# Patient Record
Sex: Male | Born: 1965 | Race: White | Hispanic: No | Marital: Single | Smoking: Never smoker
Health system: Southern US, Community
[De-identification: ages and names within clinical notes are randomized; demographics above are authoritative.]

## PROBLEM LIST (undated history)

## (undated) DIAGNOSIS — S06389A Contusion, laceration, and hemorrhage of brainstem with loss of consciousness of unspecified duration, initial encounter: Secondary | ICD-10-CM

## (undated) DIAGNOSIS — E785 Hyperlipidemia, unspecified: Secondary | ICD-10-CM

## (undated) DIAGNOSIS — I10 Essential (primary) hypertension: Secondary | ICD-10-CM

## (undated) HISTORY — DX: Hyperlipidemia, unspecified: E78.5

## (undated) HISTORY — PX: HIP ARTHROSCOPY: SUR88

## (undated) HISTORY — DX: Essential (primary) hypertension: I10

---

## 2000-01-30 ENCOUNTER — Encounter: Payer: Self-pay | Admitting: Neurosurgery

## 2000-01-30 ENCOUNTER — Encounter: Payer: Self-pay | Admitting: Emergency Medicine

## 2000-01-30 ENCOUNTER — Inpatient Hospital Stay (HOSPITAL_COMMUNITY): Admission: EM | Admit: 2000-01-30 | Discharge: 2000-03-03 | Payer: Self-pay

## 2000-01-31 ENCOUNTER — Encounter: Payer: Self-pay | Admitting: Neurosurgery

## 2000-02-01 ENCOUNTER — Encounter: Payer: Self-pay | Admitting: General Surgery

## 2000-02-02 ENCOUNTER — Encounter: Payer: Self-pay | Admitting: General Surgery

## 2000-02-03 ENCOUNTER — Encounter: Payer: Self-pay | Admitting: General Surgery

## 2000-02-05 ENCOUNTER — Encounter: Payer: Self-pay | Admitting: General Surgery

## 2000-02-06 ENCOUNTER — Encounter: Payer: Self-pay | Admitting: General Surgery

## 2000-02-07 ENCOUNTER — Encounter: Payer: Self-pay | Admitting: Neurosurgery

## 2000-02-07 ENCOUNTER — Encounter: Payer: Self-pay | Admitting: General Surgery

## 2000-02-08 ENCOUNTER — Encounter: Payer: Self-pay | Admitting: General Surgery

## 2000-02-09 ENCOUNTER — Encounter: Payer: Self-pay | Admitting: General Surgery

## 2000-02-10 ENCOUNTER — Encounter: Payer: Self-pay | Admitting: General Surgery

## 2000-02-11 ENCOUNTER — Encounter: Payer: Self-pay | Admitting: General Surgery

## 2000-02-12 ENCOUNTER — Encounter: Payer: Self-pay | Admitting: General Surgery

## 2000-02-13 ENCOUNTER — Encounter: Payer: Self-pay | Admitting: General Surgery

## 2000-02-14 ENCOUNTER — Encounter: Payer: Self-pay | Admitting: General Surgery

## 2000-02-15 ENCOUNTER — Encounter: Payer: Self-pay | Admitting: General Surgery

## 2000-02-16 ENCOUNTER — Encounter: Payer: Self-pay | Admitting: General Surgery

## 2000-02-17 ENCOUNTER — Encounter: Payer: Self-pay | Admitting: General Surgery

## 2000-02-18 ENCOUNTER — Encounter: Payer: Self-pay | Admitting: General Surgery

## 2000-02-19 ENCOUNTER — Encounter: Payer: Self-pay | Admitting: General Surgery

## 2000-02-20 ENCOUNTER — Encounter: Payer: Self-pay | Admitting: General Surgery

## 2000-02-22 ENCOUNTER — Encounter: Payer: Self-pay | Admitting: Neurosurgery

## 2000-02-26 ENCOUNTER — Encounter: Payer: Self-pay | Admitting: Neurosurgery

## 2000-03-03 ENCOUNTER — Inpatient Hospital Stay (HOSPITAL_COMMUNITY)
Admission: RE | Admit: 2000-03-03 | Discharge: 2000-05-11 | Payer: Self-pay | Admitting: Physical Medicine and Rehabilitation

## 2000-03-11 ENCOUNTER — Encounter: Payer: Self-pay | Admitting: Physical Medicine and Rehabilitation

## 2000-04-13 ENCOUNTER — Encounter: Payer: Self-pay | Admitting: Physical Medicine and Rehabilitation

## 2000-04-24 ENCOUNTER — Encounter: Payer: Self-pay | Admitting: Physical Medicine and Rehabilitation

## 2000-05-05 ENCOUNTER — Encounter: Payer: Self-pay | Admitting: Physical Medicine and Rehabilitation

## 2000-05-17 ENCOUNTER — Encounter
Admission: RE | Admit: 2000-05-17 | Discharge: 2000-08-15 | Payer: Self-pay | Admitting: Physical Medicine and Rehabilitation

## 2000-08-16 ENCOUNTER — Encounter
Admission: RE | Admit: 2000-08-16 | Discharge: 2000-09-01 | Payer: Self-pay | Admitting: Physical Medicine and Rehabilitation

## 2000-09-13 ENCOUNTER — Ambulatory Visit (HOSPITAL_COMMUNITY)
Admission: RE | Admit: 2000-09-13 | Discharge: 2000-09-13 | Payer: Self-pay | Admitting: Physical Medicine & Rehabilitation

## 2005-08-06 ENCOUNTER — Ambulatory Visit: Payer: Self-pay | Admitting: Family Medicine

## 2005-08-27 ENCOUNTER — Ambulatory Visit: Payer: Self-pay | Admitting: Family Medicine

## 2005-11-25 ENCOUNTER — Ambulatory Visit: Payer: Self-pay | Admitting: Family Medicine

## 2005-11-26 ENCOUNTER — Ambulatory Visit: Payer: Self-pay | Admitting: Family Medicine

## 2006-01-07 ENCOUNTER — Ambulatory Visit: Payer: Self-pay | Admitting: Family Medicine

## 2010-01-14 ENCOUNTER — Telehealth: Payer: Self-pay | Admitting: Family Medicine

## 2010-04-07 NOTE — Progress Notes (Signed)
Summary: pt needs appt  Phone Note Call from Patient   Caller: Mom Summary of Call: Mother states pt is trying to get a job through a vocational rehab program and they are asking for his medical records to determine if he is healthy enough to work.  I advised her that pt has not been seen in over 4 years and he would need to be seen first.  OK to schedule an appt, since he has not been seen in so long?  Paper chart is on your shelf. Initial call taken by: Lowella Petties CMA, AAMA,  January 14, 2010 1:07 PM  Follow-up for Phone Call        actually go ahead and just send his records that we have  my last note in 07 is fairly self explanatory then if they still request appt we can make it  I will put chart in IN box  Follow-up by: Judith Part MD,  January 14, 2010 1:11 PM  Additional Follow-up for Phone Call Additional follow up Details #1::        [phone] has been disconnected and Myron Stankovich alternate contact # [phone] no longer works at that #. Lewanda Rife LPN  January 15, 2010 3:44 PM   Lyla Son said [facility] will make copies and send to vocational rehab.Lewanda Rife LPN  January 20, 2010 8:42 AM

## 2014-05-20 DIAGNOSIS — H5213 Myopia, bilateral: Secondary | ICD-10-CM | POA: Diagnosis not present

## 2014-05-20 DIAGNOSIS — H4921 Sixth [abducent] nerve palsy, right eye: Secondary | ICD-10-CM | POA: Diagnosis not present

## 2015-08-15 ENCOUNTER — Emergency Department: Payer: Medicare Other

## 2015-08-15 ENCOUNTER — Encounter: Payer: Self-pay | Admitting: Emergency Medicine

## 2015-08-15 ENCOUNTER — Inpatient Hospital Stay
Admission: EM | Admit: 2015-08-15 | Discharge: 2015-08-18 | DRG: 536 | Disposition: A | Discharge status: Skilled Nursing Facility | Payer: Medicare Other | Attending: Internal Medicine | Admitting: Internal Medicine

## 2015-08-15 DIAGNOSIS — M6259 Muscle wasting and atrophy, not elsewhere classified, multiple sites: Secondary | ICD-10-CM | POA: Diagnosis present

## 2015-08-15 DIAGNOSIS — S7290XA Unspecified fracture of unspecified femur, initial encounter for closed fracture: Secondary | ICD-10-CM | POA: Diagnosis not present

## 2015-08-15 DIAGNOSIS — I10 Essential (primary) hypertension: Secondary | ICD-10-CM | POA: Diagnosis not present

## 2015-08-15 DIAGNOSIS — Z87891 Personal history of nicotine dependence: Secondary | ICD-10-CM

## 2015-08-15 DIAGNOSIS — M25551 Pain in right hip: Secondary | ICD-10-CM | POA: Diagnosis present

## 2015-08-15 DIAGNOSIS — R030 Elevated blood-pressure reading, without diagnosis of hypertension: Secondary | ICD-10-CM | POA: Diagnosis present

## 2015-08-15 DIAGNOSIS — G8191 Hemiplegia, unspecified affecting right dominant side: Secondary | ICD-10-CM | POA: Diagnosis present

## 2015-08-15 DIAGNOSIS — S72114A Nondisplaced fracture of greater trochanter of right femur, initial encounter for closed fracture: Principal | ICD-10-CM | POA: Diagnosis present

## 2015-08-15 DIAGNOSIS — W19XXXA Unspecified fall, initial encounter: Secondary | ICD-10-CM | POA: Diagnosis not present

## 2015-08-15 DIAGNOSIS — S069X0D Unspecified intracranial injury without loss of consciousness, subsequent encounter: Secondary | ICD-10-CM | POA: Diagnosis not present

## 2015-08-15 DIAGNOSIS — G9389 Other specified disorders of brain: Secondary | ICD-10-CM | POA: Diagnosis not present

## 2015-08-15 DIAGNOSIS — S72001A Fracture of unspecified part of neck of right femur, initial encounter for closed fracture: Secondary | ICD-10-CM | POA: Diagnosis not present

## 2015-08-15 DIAGNOSIS — Z7401 Bed confinement status: Secondary | ICD-10-CM | POA: Diagnosis not present

## 2015-08-15 DIAGNOSIS — S72111D Displaced fracture of greater trochanter of right femur, subsequent encounter for closed fracture with routine healing: Secondary | ICD-10-CM | POA: Diagnosis not present

## 2015-08-15 DIAGNOSIS — R739 Hyperglycemia, unspecified: Secondary | ICD-10-CM | POA: Diagnosis present

## 2015-08-15 DIAGNOSIS — Z9181 History of falling: Secondary | ICD-10-CM

## 2015-08-15 DIAGNOSIS — S72111A Displaced fracture of greater trochanter of right femur, initial encounter for closed fracture: Secondary | ICD-10-CM | POA: Diagnosis not present

## 2015-08-15 DIAGNOSIS — Z87820 Personal history of traumatic brain injury: Secondary | ICD-10-CM

## 2015-08-15 DIAGNOSIS — S3992XA Unspecified injury of lower back, initial encounter: Secondary | ICD-10-CM | POA: Diagnosis not present

## 2015-08-15 DIAGNOSIS — M545 Low back pain: Secondary | ICD-10-CM | POA: Diagnosis not present

## 2015-08-15 HISTORY — DX: Contusion, laceration, and hemorrhage of brainstem with loss of consciousness of unspecified duration, initial encounter: S06.389A

## 2015-08-15 LAB — URINALYSIS COMPLETE WITH MICROSCOPIC (ARMC ONLY)
Bacteria, UA: NONE SEEN
Bilirubin Urine: NEGATIVE
Glucose, UA: NEGATIVE mg/dL
Hgb urine dipstick: NEGATIVE
Leukocytes, UA: NEGATIVE
Nitrite: NEGATIVE
Protein, ur: NEGATIVE mg/dL
Specific Gravity, Urine: 1.021 (ref 1.005–1.030)
Squamous Epithelial / HPF: NONE SEEN
pH: 7 (ref 5.0–8.0)

## 2015-08-15 LAB — COMPREHENSIVE METABOLIC PANEL
ALT: 23 U/L (ref 17–63)
AST: 24 U/L (ref 15–41)
Albumin: 4.1 g/dL (ref 3.5–5.0)
Alkaline Phosphatase: 72 U/L (ref 38–126)
Anion gap: 6 (ref 5–15)
BUN: 11 mg/dL (ref 6–20)
CO2: 25 mmol/L (ref 22–32)
Calcium: 8.7 mg/dL — ABNORMAL LOW (ref 8.9–10.3)
Chloride: 109 mmol/L (ref 101–111)
Creatinine, Ser: 0.94 mg/dL (ref 0.61–1.24)
GFR calc Af Amer: >60 mL/min (ref >60)
GFR calc non Af Amer: >60 mL/min (ref >60)
Glucose, Bld: 118 mg/dL — ABNORMAL HIGH (ref 65–99)
Potassium: 3.5 mmol/L (ref 3.5–5.1)
Sodium: 140 mmol/L (ref 135–145)
Total Bilirubin: 1.3 mg/dL — ABNORMAL HIGH (ref 0.3–1.2)
Total Protein: 7.5 g/dL (ref 6.5–8.1)

## 2015-08-15 LAB — CBC WITH DIFFERENTIAL/PLATELET
Basophils Absolute: 0 10*3/uL (ref 0–0.1)
Basophils Relative: 0 %
EOS ABS: 0.1 10*3/uL (ref 0–0.7)
Eosinophils Relative: 2 %
HCT: 45 % (ref 40.0–52.0)
HEMOGLOBIN: 15.7 g/dL (ref 13.0–18.0)
LYMPHS ABS: 1.2 10*3/uL (ref 1.0–3.6)
Lymphocytes Relative: 20 %
MCH: 30 pg (ref 26.0–34.0)
MCHC: 34.8 g/dL (ref 32.0–36.0)
MCV: 86.2 fL (ref 80.0–100.0)
Monocytes Absolute: 0.5 10*3/uL (ref 0.2–1.0)
Monocytes Relative: 8 %
NEUTROS ABS: 4.2 10*3/uL (ref 1.4–6.5)
NEUTROS PCT: 70 %
Platelets: 138 10*3/uL — ABNORMAL LOW (ref 150–440)
RBC: 5.22 MIL/uL (ref 4.40–5.90)
RDW: 13.2 % (ref 11.5–14.5)
WBC: 6 10*3/uL (ref 3.8–10.6)

## 2015-08-15 MED ORDER — CALCIUM CARBONATE ANTACID 500 MG PO CHEW
600.0000 mg | CHEWABLE_TABLET | Freq: Every day | ORAL | Status: DC
  Administered 2015-08-15 – 2015-08-18 (×4): 600 mg via ORAL
  Filled 2015-08-15 (×4): qty 3

## 2015-08-15 MED ORDER — ONDANSETRON HCL 4 MG/2ML IJ SOLN: 4.0000 mg | Freq: Four times a day (QID) | INTRAMUSCULAR | Status: DC | PRN

## 2015-08-15 MED ORDER — ACETAMINOPHEN 325 MG PO TABS
650.0000 mg | ORAL_TABLET | Freq: Four times a day (QID) | ORAL | Status: DC | PRN
Start: 2015-08-15 — End: 2015-08-18
  Administered 2015-08-16: 650 mg via ORAL
  Filled 2015-08-15: qty 2

## 2015-08-15 MED ORDER — ONDANSETRON HCL 4 MG PO TABS: 4.0000 mg | ORAL_TABLET | Freq: Four times a day (QID) | ORAL | Status: DC | PRN

## 2015-08-15 MED ORDER — ENOXAPARIN SODIUM 40 MG/0.4ML ~~LOC~~ SOLN
40.0000 mg | SUBCUTANEOUS | Status: DC
  Administered 2015-08-15 – 2015-08-17 (×3): 40 mg via SUBCUTANEOUS
  Filled 2015-08-15 (×4): qty 0.4

## 2015-08-15 MED ORDER — ACETAMINOPHEN 650 MG RE SUPP: 650.0000 mg | Freq: Four times a day (QID) | RECTAL | Status: DC | PRN

## 2015-08-15 MED ORDER — MIRTAZAPINE 15 MG PO TABS
30.0000 mg | ORAL_TABLET | Freq: Every evening | ORAL | Status: DC | PRN
  Administered 2015-08-17: 30 mg via ORAL
  Filled 2015-08-15: qty 2

## 2015-08-15 MED ORDER — SENNOSIDES-DOCUSATE SODIUM 8.6-50 MG PO TABS: 1.0000 | ORAL_TABLET | Freq: Every evening | ORAL | Status: DC | PRN

## 2015-08-15 MED ORDER — CALCIUM CARBONATE 1500 (600 CA) MG PO TABS
600.0000 mg | ORAL_TABLET | Freq: Every day | ORAL | Status: DC
  Filled 2015-08-15: qty 1
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
## 2015-08-15 NOTE — ED Notes (Signed)
[place] three days ago from standing position.  C/o right buttock pain.  Patient has also fallen more frequently since initial fall.  Family is concerned about frequent falls and "change in balance"

## 2015-08-15 NOTE — ED Notes (Signed)
Attempted to call report. Awaiting call back.
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
## 2015-08-16 LAB — BASIC METABOLIC PANEL
Anion gap: 8 (ref 5–15)
BUN: 12 mg/dL (ref 6–20)
CALCIUM: 9.1 mg/dL (ref 8.9–10.3)
CHLORIDE: 108 mmol/L (ref 101–111)
CO2: 24 mmol/L (ref 22–32)
CREATININE: 1.1 mg/dL (ref 0.61–1.24)
GFR calc Af Amer: >60 mL/min (ref >60)
GFR calc non Af Amer: >60 mL/min (ref >60)
GLUCOSE: 181 mg/dL — AB (ref 65–99)
Potassium: 3.7 mmol/L (ref 3.5–5.1)
Sodium: 140 mmol/L (ref 135–145)

## 2015-08-16 LAB — CBC
HCT: 45.2 % (ref 40.0–52.0)
Hemoglobin: 15.7 g/dL (ref 13.0–18.0)
MCH: 30.4 pg (ref 26.0–34.0)
MCHC: 34.8 g/dL (ref 32.0–36.0)
MCV: 87.4 fL (ref 80.0–100.0)
PLATELETS: 157 10*3/uL (ref 150–440)
RBC: 5.17 MIL/uL (ref 4.40–5.90)
RDW: 13.4 % (ref 11.5–14.5)
WBC: 7 10*3/uL (ref 3.8–10.6)

## 2015-08-16 LAB — HEMOGLOBIN A1C: Hgb A1c MFr Bld: 5.2 % (ref 4.0–6.0)

## 2015-08-16 MED ORDER — METOPROLOL TARTRATE 25 MG PO TABS
25.0000 mg | ORAL_TABLET | Freq: Two times a day (BID) | ORAL | Status: DC
  Administered 2015-08-16 – 2015-08-17 (×3): 25 mg via ORAL
  Filled 2015-08-16 (×3): qty 1

## 2015-08-16 MED ORDER — METOPROLOL TARTRATE 25 MG PO TABS: 25.0000 mg | ORAL_TABLET | Freq: Two times a day (BID) | ORAL | Status: DC

## 2015-08-16 NOTE — Discharge Summary (Addendum)
[place] at [place] NAME: Roy Ellison    MR#:  [id number]  [place]:  [birth year]  DATE OF ADMISSION:  08/15/2015 ADMITTING PHYSICIAN: Bettey Costa, MD  DATE OF DISCHARGE: 08/18/2015  PRIMARY CARE PHYSICIAN: No PCP Per Patient    ADMISSION DIAGNOSIS:  Hip fracture, right, closed, initial encounter ([place]) [S72.001A]  DISCHARGE DIAGNOSIS:  Active Problems:   Fall   SECONDARY DIAGNOSIS:   Past Medical History  Diagnosis Date  . Brain stem injury [facility])     HOSPITAL COURSE:   50 year old male with a history of TBI and right hemiparesis who normally ambulates who is unable to ambulate after multiple falls and CT scan showing nondisplaced fracture of right greater trochanter.  1. Minimally displaced greater trochanteric of the lesion injury: Patient was evaluated by orthopedic surgery and physical therapy. This is a non-operable injury. Physical therapy recommended Skilled nursing facility at discharge.. Patient was not having any pain due to history of traumatic brain injury he does not have any sensation on the right side.  2. Hyperglycemia: Hemoglobin A1c was 5.2  3. Elevated blood pressure no diagnosis of hypertension: His blood pressure was significantly elevated here. He was started on metoprolol and will need follow-up with a primary care physician. 4. TBI:   DISCHARGE CONDITIONS AND DIET:   Stable for discharge on a regular diet  CONSULTS OBTAINED:  Treatment Team:  Hessie Knows, MD  DRUG ALLERGIES:  No Known Allergies  DISCHARGE MEDICATIONS:   Current Discharge Medication List    START taking these medications   Details  metoprolol tartrate (LOPRESSOR) 50 MG tablet Take 1 tablet (50 mg total) by mouth 2 (two) times daily. Qty: 60 tablet, Refills: 0      CONTINUE these medications which have NOT CHANGED   Details  calcium carbonate (OSCAL) 1500 (600 Ca) MG TABS tablet Take 600 mg of elemental calcium by mouth  daily.    ibuprofen (ADVIL,MOTRIN) 200 MG tablet Take 200 mg by mouth every 6 (six) hours as needed.    mirtazapine (REMERON) 30 MG tablet Take 1 tablet by mouth at bedtime as needed.              Today   CHIEF COMPLAINT:   Not complaining of pain. Patient is ready for discharge. No acute events reported overnight.   VITAL SIGNS:  Blood pressure 139/77, pulse 64, temperature 98.2 F (36.8 C), temperature source Oral, resp. rate 18, height 5\' 8"  (1.727 m), weight 89.268 kg (196 lb 12.8 oz), SpO2 95 %.   REVIEW OF SYSTEMS:  Review of Systems  Constitutional: Negative for fever, chills and malaise/fatigue.  HENT: Negative for ear discharge, ear pain, hearing loss, nosebleeds and sore throat.   Eyes: Negative for blurred vision and pain.  Respiratory: Negative for cough, hemoptysis, shortness of breath and wheezing.   Cardiovascular: Negative for chest pain, palpitations and leg swelling.  Gastrointestinal: Negative for nausea, vomiting, abdominal pain, diarrhea and blood in stool.  Genitourinary: Negative for dysuria.  Musculoskeletal: Negative for back pain.  Neurological: Negative for dizziness, tremors, speech change, focal weakness, seizures and headaches.  Endo/Heme/Allergies: Does not bruise/bleed easily.  Psychiatric/Behavioral: Negative for depression, suicidal ideas and hallucinations.     PHYSICAL EXAMINATION:  GENERAL:  50 y.o.-year-old patient lying in the bed with no acute distress.  NECK:  Supple, no jugular venous distention. No thyroid enlargement, no tenderness.  LUNGS: Normal breath sounds bilaterally, no wheezing, rales,rhonchi  No use of  accessory muscles of respiration.  CARDIOVASCULAR: S1, S2 normal. No murmurs, rubs, or gallops.  ABDOMEN: Soft, non-tender, non-distended. Bowel sounds present. No organomegaly or mass.  EXTREMITIES: No pedal edema, cyanosis, or clubbing.  PSYCHIATRIC: The patient is alert and oriented x 3.  SKIN: No obvious rash,  lesion, or ulcer. Decreased range of motion right hip Baseline right hemiparesis from TBI DATA REVIEW:   CBC  Recent Labs Lab 08/16/15 0340  WBC 7.0  HGB 15.7  HCT 45.2  PLT 157    Chemistries   Recent Labs Lab 08/15/15 1116 08/16/15 0340  NA 140 140  K 3.5 3.7  CL 109 108  CO2 25 24  GLUCOSE 118* 181*  BUN 11 12  CREATININE 0.94 1.10  CALCIUM 8.7* 9.1  AST 24  --   ALT 23  --   ALKPHOS 72  --   BILITOT 1.3*  --     Cardiac Enzymes No results for input(s): TROPONINI in the last 168 hours.  Microbiology Results  @[email or web address] @  RADIOLOGY:  No results found.    Management plans discussed with the patient and he is in agreement. Stable for discharge home with [facility]  Patient should follow up with a PCP. He was advised to find a primary care physician to follow up with.  CODE STATUS:     Code Status Orders        Start     Ordered   08/15/15 1543  Full code   Continuous     08/15/15 1546    Code Status History    Date Active Date Inactive Code Status Order ID Comments User Context   This patient has a current code status but no historical code status.      TOTAL TIME TAKING CARE OF THIS PATIENT: 36 minutes.    Note: This dictation was prepared with Dragon dictation along with smaller phrase technology. Any transcriptional errors that result from this process are unintentional.  Lind Ausley M.D on 08/18/2015 at 8:36 AM  Between 7am to 6pm - Pager - [phone] After 6pm go to [email or web address] - password [email or web address] [place] [facility]  Office  [phone]  CC: Primary care physician; No PCP Per Patient

## 2015-08-16 NOTE — Evaluation (Signed)
Physical Therapy Evaluation Patient Details Name: Roy Ellison MRN: [id number] DOB: 03-02-1966 Today's Date: 08/16/2015   History of Present Illness  50 y/o male with h/o TBI with signficant R side weakness and sensory loss.  He has been having frequent falls recently and was found to have a non-displaced R hip fx.   Clinical Impression  Pt is pleasant and shows good effort t/o PT but is very limited with standing acts.  He has poor strength and sensation on R at baseline from TBI, but today his knee buckles with any attempt to shift weight onto it.  Despite good R UE strength and use of the hemiwalker he is simply not stable enough on the R to try doing any ambulation.  Pt is not safe to return home and will need rehab to get back to a level of function to be able to go home.    Follow Up Recommendations SNF    Equipment Recommendations   (hemiwalker?)    Recommendations for Other Services       Precautions / Restrictions Precautions Precautions: Fall Restrictions Weight Bearing Restrictions: Yes RLE Weight Bearing: Weight bearing as tolerated      Mobility  Bed Mobility Overal bed mobility: Needs Assistance Bed Mobility: Supine to Sit;Sit to Supine     Supine to sit: Min assist Sit to supine: Min assist   General bed mobility comments: Pt shows good effort trying to get to EOB and using his L UE is able to pull heavily on rails and does not need excessive PT assist  Transfers Overall transfer level: Needs assistance Equipment used: Hemi-walker Transfers: Sit to/from Stand Sit to Stand: Mod assist         General transfer comment: Pt initially leaning back of legs heavily on bed to remain upright.  Pt is able to statically stand with heavy HW reliance and inability to shift weight onto the R.  Ambulation/Gait             General Gait Details: attempted to step, unable to do so safely.  With attempts to put any weight on the R to advance L he has R knee  buckling is essentially unable to keep himself up needing heavy assist and ultiamtely not being safe to try taking a step.   Stairs            Wheelchair Mobility    Modified Rankin (Stroke Patients Only)       Balance Overall balance assessment: Needs assistance Sitting-balance support: Single extremity supported Sitting balance-Leahy Scale: Good     Standing balance support: Single extremity supported Standing balance-Leahy Scale: Poor Standing balance comment: Pt struggles to maintain any WBing on R and is unsafe with sensation deficits and buckling                              Pertinent Vitals/Pain Pain Assessment:  (Pt unable to really feel pain on the R)    Home Living Family/patient expects to be discharged to:: Private residence Living Arrangements: Alone Available Help at Discharge: Family Type of Home: House Home Access: Stairs to enter   Technical brewer of Steps: 4   Home Equipment: Cane - quad;Cane - single point      Prior Function Level of Independence: Independent with assistive device(s)         Comments: Pt did not drive and was a slow walker but able to do what he needed  Hand Dominance        Extremity/Trunk Assessment   Upper Extremity Assessment: RUE deficits/detail (L strong with WFL ROM) RUE Deficits / Details: Pt with very limited AROM and essentially is non-functional with what he does have   RUE Sensation: decreased light touch;decreased proprioception     Lower Extremity Assessment: RLE deficits/detail (L WFL with strength and ROM) RLE Deficits / Details: Pt with very limited R LE strength. Pt lacks TKE on R, has no ankle AROM and what AROM he does have in the hip is very limited       Communication   Communication: Expressive difficulties  Cognition Arousal/Alertness: Awake/alert Behavior During Therapy: WFL for tasks assessed/performed Overall Cognitive Status: Within Functional Limits for  tasks assessed (appears at his baseline)                      General Comments      Exercises        Assessment/Plan    PT Assessment Patient needs continued PT services  PT Diagnosis Difficulty walking;Generalized weakness   PT Problem List Decreased strength;Decreased range of motion;Decreased activity tolerance;Decreased balance;Decreased mobility;Decreased safety awareness  PT Treatment Interventions DME instruction;Gait training;Stair training;Functional mobility training;Therapeutic activities;Therapeutic exercise;Balance training;Neuromuscular re-education;Cognitive remediation;Patient/family education   PT Goals (Current goals can be found in the Care Plan section) Acute Rehab PT Goals Patient Stated Goal: "I want to get back home" PT Goal Formulation: With patient/family Time For Goal Achievement: 08/30/15 Potential to Achieve Goals: Fair    Frequency 7X/week   Barriers to discharge        Co-evaluation               End of Session Equipment Utilized During Treatment: Gait belt Activity Tolerance: Other (comment) (pt unable to take steps safely)             Time: [id number] PT Time Calculation (min) (ACUTE ONLY): 21 min   Charges:   PT Evaluation $PT Eval Moderate Complexity: 1 Procedure     PT G CodesKreg Shropshire, DPT  08/16/2015, 11:58 AM

## 2015-08-16 NOTE — Progress Notes (Signed)
Pt to be discharged tomorrow or Monday to SNF for rehab. Failed PT evaluation. Dr. Benjie Karvonen and Care manager Jeani Hawking aware. Patient's wife also aware.

## 2015-08-16 NOTE — Consult Note (Signed)
50 year old white male seen for evaluation after a fall and right hip fracture. Patient was walking at home yesterday down to his mailbox and when he came back up with me on his hand as he opened the door he lost his balance and fell. He does have a history significant for treatment brain injury and profound right-sided weakness upper and lower extremities. He normally walks without assistive device he reports and works out with doing rehabilitation exercises 3 times a week. He was unable to ambulate after this fall and came to the emergency room where greater trochanter fracture was identified and CT scan confirms it is greater trochanter does not appear to have inter-trochanteric extension  On exam he has profound muscle atrophy of the right upper and lower extremity with significant weakness to abduction on the right hip he is unable to flex extend the ankle to request. He has tenderness over the greater trochanter.  CT reviewed along with x-ray showing minimally displaced greater trochanter avulsion injury  Impression isolated greater trochanteric fracture in the setting with profound weakness on this right side  Recommendation is for physical therapy he may need a hemiwalker on the left or possibly platform walker with right forearm platform attachment the to try to get him mobilized. Not certain as to how much she will be able to cooperate with therapy secondary to some apparent confusion and discussing his prior history
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
## 2015-08-17 MED ORDER — METOPROLOL TARTRATE 25 MG PO TABS
25.0000 mg | ORAL_TABLET | Freq: Once | ORAL | Status: AC
  Administered 2015-08-17: 25 mg via ORAL
  Filled 2015-08-17: qty 1

## 2015-08-17 MED ORDER — METOPROLOL TARTRATE 50 MG PO TABS
50.0000 mg | ORAL_TABLET | Freq: Two times a day (BID) | ORAL | Status: DC
  Administered 2015-08-17 – 2015-08-18 (×2): 50 mg via ORAL
  Filled 2015-08-17 (×2): qty 1
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
## 2015-08-17 NOTE — Progress Notes (Signed)
Physical Therapy Treatment Patient Details Name: Roy Ellison MRN: [id number] DOB: Sep 24, 1965 Today's Date: 08/17/2015    History of Present Illness 50 y/o male with h/o TBI with signficant R side weakness and sensory loss.  He has been having frequent falls recently and was found to have a non-displaced R hip fx.     PT Comments    Pt is very pleasant and motivated with PT but is unable to really take any weight through the R and this is very limiting.  PT attempted to block knee from buckling and actively assist foot onto the floor but he simply was unable to effectively take any weight through the R.  Pt shows great strength and effort with L side, given his lack of sensation on the R with the guarded posturing and inability to straighten knee mobility/ambulation will likely be very difficult.  Follow Up Recommendations  SNF     Equipment Recommendations       Recommendations for Other Services       Precautions / Restrictions Precautions Precautions: Fall Restrictions Weight Bearing Restrictions: No RLE Weight Bearing: Weight bearing as tolerated    Mobility  Bed Mobility Overal bed mobility: Needs Assistance Bed Mobility: Supine to Sit     Supine to sit: Min guard     General bed mobility comments: Pt is able to slowly get to sitting EOB with only L extremities assisting.  Transfers Overall transfer level: Needs assistance Equipment used: Quad cane Transfers: Sit to/from Stand Sit to Stand: Min assist         General transfer comment: Pt shows good effort with L LE in trying to get to standing, but his R hip and knee stay slightly flexed and he needs direct assist just to get foot onto the ground, unable to effectively take any weight through it.  Ambulation/Gait             General Gait Details: again attempted to step.  Despite being determined to try and with good L sided strength he is unable to put any real weigh through R LE (even with PT  blocking knee) to even scoot L foot 1/4"   Stairs            Wheelchair Mobility    Modified Rankin (Stroke Patients Only)       Balance                                    Cognition Arousal/Alertness: Awake/alert Behavior During Therapy: WFL for tasks assessed/performed Overall Cognitive Status: Within Functional Limits for tasks assessed                      Exercises General Exercises - Lower Extremity Ankle Circles/Pumps: 10 reps;AAROM Quad Sets: AAROM;10 reps Short Arc Quad: AAROM;10 reps Heel Slides: AROM;10 reps Hip ABduction/ADduction: AAROM;10 reps    General Comments        Pertinent Vitals/Pain Pain Assessment:  (Pt cannot feel pain in R hip 2/2 TBI)    Home Living                      Prior Function            PT Goals (current goals can now be found in the care plan section) Progress towards PT goals: Progressing toward goals    Frequency  7X/week    PT  Plan Current plan remains appropriate    Co-evaluation             End of Session Equipment Utilized During Treatment: Gait belt Activity Tolerance:  (eager to participate, unable to take weight through R LE) Patient left: with call bell/phone within reach;with bed alarm set     Time: [id number] PT Time Calculation (min) (ACUTE ONLY): 25 min  Charges:  $Therapeutic Exercise: 8-22 mins $Therapeutic Activity: 8-22 mins                    G Codes:      Kreg Shropshire, DPT  08/17/2015, 12:04 PM

## 2015-08-17 NOTE — Progress Notes (Signed)
Patient is in high spirits with extraordinary belief and as such believes he is healed and may not always adhere to proper medical precautions. Wife is to be apprised of his discharge plans, as she will be the support system. Wife is being intimidated by family however, and refuses to be present simultaneously. The angst between the parties is agitating the patient and placing him in emotional distress.

## 2015-08-17 NOTE — Care Management Important Message (Signed)
Important Message  Patient Details  Name: Roy Ellison MRN: [id number] Date of Birth: [birth year]   Medicare Important Message Given:  Yes    Duvan Mousel A, RN 08/17/2015, 1:38 PM

## 2015-08-17 NOTE — Care Management Note (Addendum)
Case Management Note  Patient Details  Name: Roy Ellison MRN: [id number] Date of Birth: 1966-01-18  Subjective/Objective:    Dr Rudene Christians and [facility]-PT are recommending Rehab and [name] agrees that although he wants to go home that he is currently not safe to do so. A social work consult has been made to evaluate possibility of Rehab placement. Case management will follow if [name] is discharged to home.                 Action/Plan:   Expected Discharge Date:  08/16/15               Expected Discharge Plan:     In-House Referral:     Discharge planning Services     Post Acute Care Choice:    Choice offered to:     DME Arranged:    DME Agency:     HH Arranged:    [place] Agency:     Status of Service:     Medicare Important Message Given:    Date Medicare IM Given:    Medicare IM give by:    Date Additional Medicare IM Given:    Additional Medicare Important Message give by:     If discussed at [place] of Stay Meetings, dates discussed:    Additional Comments:  Violeta Lecount A, RN 08/17/2015, 8:53 AM

## 2015-08-17 NOTE — NC FL2 (Signed)
  [place] LEVEL OF CARE SCREENING TOOL     IDENTIFICATION  Patient Name: Roy Ellison Birthdate: Mar 08, 1966 Sex: male Admission Date (Current Location): 08/15/2015  [place] and [place] Number:  Engineering geologist and Address:  [facility], [address], [place], [place] [address]      Provider Number: [id number]  Attending Physician Name and Address:  Bettey Costa, MD  Relative Name and Phone Number:       Current Level of Care: Hospital Recommended Level of Care: [place] Prior Approval Number:    Date Approved/Denied:   PASRR Number:  ([id number])  Discharge Plan: SNF    Current Diagnoses: Patient Active Problem List   Diagnosis Date Noted  . Fall 08/15/2015    Orientation RESPIRATION BLADDER Height & Weight     Self, Place, Time  Normal Continent Weight: 196 lb 12.8 oz (89.268 kg) Height:  5\' 8"  (172.7 cm)  BEHAVIORAL SYMPTOMS/MOOD NEUROLOGICAL BOWEL NUTRITION STATUS   (None)  (None) Continent Diet (Regular)  AMBULATORY STATUS COMMUNICATION OF NEEDS Skin   Extensive Assist Verbally Normal                       Personal Care Assistance Level of Assistance  Bathing, Feeding, Dressing Bathing Assistance: Limited assistance Feeding assistance: Independent Dressing Assistance: Limited assistance     Functional Limitations Info  Sight, Hearing, Speech Sight Info: Impaired Hearing Info: Adequate Speech Info: Adequate    SPECIAL CARE FACTORS FREQUENCY  PT (By licensed PT)     PT Frequency:  (5)              Contractures      Additional Factors Info  Code Status, Allergies Code Status Info:  (Full Code) Allergies Info:  (No Known Allergies)           Current Medications (08/17/2015):  This is the current hospital active medication list Current Facility-Administered Medications  Medication Dose Route Frequency Provider Last Rate Last Dose  . acetaminophen (TYLENOL) tablet 650  mg  650 mg Oral Q6H PRN Bettey Costa, MD   650 mg at 08/16/15 [id number]   Or  . acetaminophen (TYLENOL) suppository 650 mg  650 mg Rectal Q6H PRN Bettey Costa, MD      . calcium carbonate (TUMS - dosed in mg elemental calcium) chewable tablet 600 mg of elemental calcium  600 mg of elemental calcium Oral Daily Sital Mody, MD   600 mg of elemental calcium at 08/17/15 0946  . enoxaparin (LOVENOX) injection 40 mg  40 mg Subcutaneous Q24H Bettey Costa, MD   40 mg at 08/16/15 1648  . metoprolol (LOPRESSOR) tablet 50 mg  50 mg Oral BID Bettey Costa, MD      . mirtazapine (REMERON) tablet 30 mg  30 mg Oral QHS PRN Bettey Costa, MD      . ondansetron (ZOFRAN) tablet 4 mg  4 mg Oral Q6H PRN Bettey Costa, MD       Or  . ondansetron (ZOFRAN) injection 4 mg  4 mg Intravenous Q6H PRN Sital Mody, MD      . senna-docusate (Senokot-S) tablet 1 tablet  1 tablet Oral QHS PRN Bettey Costa, MD         Discharge Medications: Please see discharge summary for a list of discharge medications.  Relevant Imaging Results:  Relevant Lab Results:   Additional Information  (SSN [id number])  Lorenso Quarry Shine [place], LCSW

## 2015-08-17 NOTE — Progress Notes (Signed)
Patient still unable to ambulate.  Has profound right leg weakness.  Has to lock knee in extension to walk and is having trouble doing this.  No ecchymosis or evidence of knee injury that would limit him.

## 2015-08-18 DIAGNOSIS — Z7401 Bed confinement status: Secondary | ICD-10-CM | POA: Diagnosis not present

## 2015-08-18 DIAGNOSIS — S7290XA Unspecified fracture of unspecified femur, initial encounter for closed fracture: Secondary | ICD-10-CM | POA: Diagnosis not present

## 2015-08-18 DIAGNOSIS — G9389 Other specified disorders of brain: Secondary | ICD-10-CM | POA: Diagnosis not present

## 2015-08-18 DIAGNOSIS — R296 Repeated falls: Secondary | ICD-10-CM | POA: Diagnosis not present

## 2015-08-18 DIAGNOSIS — R030 Elevated blood-pressure reading, without diagnosis of hypertension: Secondary | ICD-10-CM | POA: Diagnosis not present

## 2015-08-18 DIAGNOSIS — Z87820 Personal history of traumatic brain injury: Secondary | ICD-10-CM | POA: Diagnosis not present

## 2015-08-18 DIAGNOSIS — M810 Age-related osteoporosis without current pathological fracture: Secondary | ICD-10-CM | POA: Diagnosis not present

## 2015-08-18 DIAGNOSIS — S069X0D Unspecified intracranial injury without loss of consciousness, subsequent encounter: Secondary | ICD-10-CM | POA: Diagnosis not present

## 2015-08-18 DIAGNOSIS — G8191 Hemiplegia, unspecified affecting right dominant side: Secondary | ICD-10-CM | POA: Diagnosis not present

## 2015-08-18 DIAGNOSIS — S72111D Displaced fracture of greater trochanter of right femur, subsequent encounter for closed fracture with routine healing: Secondary | ICD-10-CM | POA: Diagnosis not present

## 2015-08-18 DIAGNOSIS — W19XXXA Unspecified fall, initial encounter: Secondary | ICD-10-CM | POA: Diagnosis not present

## 2015-08-18 DIAGNOSIS — I10 Essential (primary) hypertension: Secondary | ICD-10-CM | POA: Diagnosis not present

## 2015-08-18 DIAGNOSIS — R739 Hyperglycemia, unspecified: Secondary | ICD-10-CM | POA: Diagnosis not present

## 2015-08-18 MED ORDER — METOPROLOL TARTRATE 50 MG PO TABS: 50.0000 mg | ORAL_TABLET | Freq: Two times a day (BID) | ORAL | Status: DC

## 2015-08-18 NOTE — Progress Notes (Signed)
RN just gave report to Early Osmond at [facility]. EMS called.   Deri Fuelling, RN

## 2015-08-18 NOTE — Progress Notes (Signed)
[place] to be D/C'd Rehab per MD order.  Discussed with the patient and all questions fully answered.  VSS, Skin clean, dry and intact without evidence of skin break down, no evidence of skin tears noted. IV catheter discontinued intact. Site without signs and symptoms of complications. Dressing and pressure applied.  Patient transferred to [facility] via EMS.  Deri Fuelling 08/18/2015 12:06 PM

## 2015-08-18 NOTE — Care Management Note (Signed)
Case Management Note  Patient Details  Name: Roy Ellison MRN: [id number] Date of Birth: 1965-08-12  Subjective/Objective:               PCP=Dr Hande     Action/Plan:   Expected Discharge Date:  08/16/15               Expected Discharge Plan:     In-House Referral:     Discharge planning Services     Post Acute Care Choice:    Choice offered to:     DME Arranged:    DME Agency:     HH Arranged:    [place] Agency:     Status of Service:     Medicare Important Message Given:  Yes Date Medicare IM Given:    Medicare IM give by:    Date Additional Medicare IM Given:    Additional Medicare Important Message give by:     If discussed at [place] of Stay Meetings, dates discussed:    Additional Comments:  Dannah Ryles A, RN 08/18/2015, 12:30 PM

## 2015-08-18 NOTE — Progress Notes (Signed)
Clinical Social Worker was informed that patient will be medically ready to discharge to [facility]. Patient and his wife- Hoyle Sauer are in a agreement with plan. CSW called Marden Noble- [employer] Coordinator to confirm that patient's bed is ready. Provided patient's room number 410 and number to call for report [phone] . All discharge information faxed to [facility] via [place]. RN will call report and patient will discharge to [facility] via [facility].   Ernest Pine, MSW, [place] Social Work Department [phone]

## 2015-08-18 NOTE — Clinical Social Work Note (Signed)
Clinical Social Work Assessment  Patient Details  Name: Roy Ellison MRN: [id number] Date of Birth: Mar 27, 1965  Date of referral:  08/18/15               Reason for consult:  Discharge Planning                Permission sought to share information with:  Family Supports Permission granted to share information::  Yes, Verbal Permission Granted  Name::        Agency::     Relationship::   (Carolyn-Wife)  Contact Information:     Housing/Transportation Living arrangements for the past 2 months:  Single Family Home Source of Information:  Patient Patient Interpreter Needed:  None Criminal Activity/Legal Involvement Pertinent to Current Situation/Hospitalization:  No - Comment as needed Significant Relationships:  Other Family Members, Spouse Lives with:  Spouse Do you feel safe going back to the place where you live?  Yes Need for family participation in patient care:  Yes (Comment) Hoyle Sauer- Wife )  Care giving concerns:  PT is suggesting patient go to STR at discharge.    Social Worker assessment / plan:  CSW met with patient at bedside. Introduced herself and her role. Per patient he is open to going to rehab. Stated he's not familiar with SNFs in [place]. Requested CSW to speak to his wife. Provided wife's number. CSW spoke to patient's wife over the telephone. Presented bed offer. Accepted bed at [facility]. Stated she understands that patient will be discharged today. Requested patient to be transported via EMS. Verbal permission granted to coordinate discharge with [facility]. CSW left voicemail for [employer]- Admissions at [facility]. Awaiting phone call back. CSW will continue to follow and assist.   Employment status:  Retired Forensic scientist:  Medicare PT Recommendations:  [place] / Referral to community resources:  [place]  Patient/Family's Response to care:  Patient's wife is Patent attorney of CSW's  assistance. Reported that she feels patient will benefit from PT at [facility].   Patient/Family's Understanding of and Emotional Response to Diagnosis, Current Treatment, and Prognosis:  Patient's wife understands why he needs Rehab. Reported that she feels okay about STR placement.   Emotional Assessment Appearance:  Appears stated age Attitude/Demeanor/Rapport:   (None) Affect (typically observed):  Calm, Pleasant Orientation:  Oriented to Self, Oriented to Place, Oriented to  Time, Oriented to Situation Alcohol / Substance use:  Not Applicable Psych involvement (Current and /or in the community):  No (Comment)  Discharge Needs  Concerns to be addressed:  Discharge Planning Concerns Readmission within the last 30 days:  No Current discharge risk:  Chronically ill Barriers to Discharge:  Continued Medical Work up   [employer], LCSW 08/18/2015, 10:03 AM

## 2015-08-18 NOTE — Progress Notes (Signed)
   08/18/15 1131  Clinical Encounter Type  Visited With Patient and family together  Visit Type Follow-up  Consult/Referral To Chaplain  Spiritual Encounters  Spiritual Needs Prayer  Followed up with patient based on previous on-call chaplains request.  Patient is in good spirits and was receptive to more visitation.

## 2015-08-18 NOTE — Clinical Social Work Placement (Signed)
   CLINICAL SOCIAL WORK PLACEMENT  NOTE  Date:  08/18/2015  Patient Details  Name: Roy Ellison MRN: [id number] Date of Birth: 01-28-1966  Clinical Social Work is seeking post-discharge placement for this patient at the [place] level of care (*CSW will initial, date and re-position this form in  chart as items are completed):  No   Patient/family provided with [place] Work Department's list of facilities offering this level of care within the geographic area requested by the patient (or if unable, by the patient's family).  No   Patient/family informed of their freedom to choose among providers that offer the needed level of care, that participate in Medicare, Medicaid or managed care program needed by the patient, have an available bed and are willing to accept the patient.  No   Patient/family informed of [facility]'s ownership interest in [facility] and [facility], as well as of the fact that they are under no obligation to receive care at these facilities.  PASRR submitted to EDS on 08/18/15     PASRR number received on 08/18/15     Existing PASRR number confirmed on       FL2 transmitted to all facilities in geographic area requested by pt/family on 08/18/15     FL2 transmitted to all facilities within larger geographic area on       Patient informed that his/her managed care company has contracts with or will negotiate with certain facilities, including the following:        Yes   Patient/family informed of bed offers received.  Patient chooses bed at  [facility])     Physician recommends and patient chooses bed at      Patient to be transferred to  [facility]) on 08/18/15.  Patient to be transferred to facility by  (EMS)     Patient family notified on 08/18/15 of transfer.  Name of family member notified:   Hoyle Sauer- Wife)     PHYSICIAN       Additional Comment:     _______________________________________________ Baldemar Lenis, LCSW 08/18/2015, 10:46 AM

## 2015-08-20 DIAGNOSIS — R296 Repeated falls: Secondary | ICD-10-CM | POA: Diagnosis not present

## 2015-08-20 DIAGNOSIS — I10 Essential (primary) hypertension: Secondary | ICD-10-CM | POA: Diagnosis not present

## 2015-08-20 DIAGNOSIS — Z87820 Personal history of traumatic brain injury: Secondary | ICD-10-CM | POA: Diagnosis not present

## 2015-08-20 DIAGNOSIS — M810 Age-related osteoporosis without current pathological fracture: Secondary | ICD-10-CM | POA: Diagnosis not present

## 2015-09-08 DIAGNOSIS — W1839XD Other fall on same level, subsequent encounter: Secondary | ICD-10-CM | POA: Diagnosis not present

## 2015-09-08 DIAGNOSIS — M80851D Other osteoporosis with current pathological fracture, right femur, subsequent encounter for fracture with routine healing: Secondary | ICD-10-CM | POA: Diagnosis not present

## 2015-09-08 DIAGNOSIS — I10 Essential (primary) hypertension: Secondary | ICD-10-CM | POA: Diagnosis not present

## 2015-09-08 DIAGNOSIS — Z9181 History of falling: Secondary | ICD-10-CM | POA: Diagnosis not present

## 2015-09-08 DIAGNOSIS — S069X0S Unspecified intracranial injury without loss of consciousness, sequela: Secondary | ICD-10-CM | POA: Diagnosis not present

## 2015-09-08 DIAGNOSIS — G8191 Hemiplegia, unspecified affecting right dominant side: Secondary | ICD-10-CM | POA: Diagnosis not present

## 2015-09-11 DIAGNOSIS — M80851D Other osteoporosis with current pathological fracture, right femur, subsequent encounter for fracture with routine healing: Secondary | ICD-10-CM | POA: Diagnosis not present

## 2015-09-11 DIAGNOSIS — W1839XD Other fall on same level, subsequent encounter: Secondary | ICD-10-CM | POA: Diagnosis not present

## 2015-09-11 DIAGNOSIS — Z9181 History of falling: Secondary | ICD-10-CM | POA: Diagnosis not present

## 2015-09-11 DIAGNOSIS — G8191 Hemiplegia, unspecified affecting right dominant side: Secondary | ICD-10-CM | POA: Diagnosis not present

## 2015-09-11 DIAGNOSIS — I10 Essential (primary) hypertension: Secondary | ICD-10-CM | POA: Diagnosis not present

## 2015-09-11 DIAGNOSIS — S069X0S Unspecified intracranial injury without loss of consciousness, sequela: Secondary | ICD-10-CM | POA: Diagnosis not present

## 2015-09-12 DIAGNOSIS — S069X0S Unspecified intracranial injury without loss of consciousness, sequela: Secondary | ICD-10-CM | POA: Diagnosis not present

## 2015-09-12 DIAGNOSIS — G8191 Hemiplegia, unspecified affecting right dominant side: Secondary | ICD-10-CM | POA: Diagnosis not present

## 2015-09-12 DIAGNOSIS — I10 Essential (primary) hypertension: Secondary | ICD-10-CM | POA: Diagnosis not present

## 2015-09-12 DIAGNOSIS — Z9181 History of falling: Secondary | ICD-10-CM | POA: Diagnosis not present

## 2015-09-12 DIAGNOSIS — M80851D Other osteoporosis with current pathological fracture, right femur, subsequent encounter for fracture with routine healing: Secondary | ICD-10-CM | POA: Diagnosis not present

## 2015-09-12 DIAGNOSIS — W1839XD Other fall on same level, subsequent encounter: Secondary | ICD-10-CM | POA: Diagnosis not present

## 2015-09-15 DIAGNOSIS — W1839XD Other fall on same level, subsequent encounter: Secondary | ICD-10-CM | POA: Diagnosis not present

## 2015-09-15 DIAGNOSIS — I10 Essential (primary) hypertension: Secondary | ICD-10-CM | POA: Diagnosis not present

## 2015-09-15 DIAGNOSIS — S069X0S Unspecified intracranial injury without loss of consciousness, sequela: Secondary | ICD-10-CM | POA: Diagnosis not present

## 2015-09-15 DIAGNOSIS — M80851D Other osteoporosis with current pathological fracture, right femur, subsequent encounter for fracture with routine healing: Secondary | ICD-10-CM | POA: Diagnosis not present

## 2015-09-15 DIAGNOSIS — Z9181 History of falling: Secondary | ICD-10-CM | POA: Diagnosis not present

## 2015-09-15 DIAGNOSIS — G8191 Hemiplegia, unspecified affecting right dominant side: Secondary | ICD-10-CM | POA: Diagnosis not present

## 2015-09-17 DIAGNOSIS — Z9181 History of falling: Secondary | ICD-10-CM | POA: Diagnosis not present

## 2015-09-17 DIAGNOSIS — S069X0S Unspecified intracranial injury without loss of consciousness, sequela: Secondary | ICD-10-CM | POA: Diagnosis not present

## 2015-09-17 DIAGNOSIS — I10 Essential (primary) hypertension: Secondary | ICD-10-CM | POA: Diagnosis not present

## 2015-09-17 DIAGNOSIS — G8191 Hemiplegia, unspecified affecting right dominant side: Secondary | ICD-10-CM | POA: Diagnosis not present

## 2015-09-17 DIAGNOSIS — W1839XD Other fall on same level, subsequent encounter: Secondary | ICD-10-CM | POA: Diagnosis not present

## 2015-09-17 DIAGNOSIS — M80851D Other osteoporosis with current pathological fracture, right femur, subsequent encounter for fracture with routine healing: Secondary | ICD-10-CM | POA: Diagnosis not present

## 2015-09-19 DIAGNOSIS — S069X0S Unspecified intracranial injury without loss of consciousness, sequela: Secondary | ICD-10-CM | POA: Diagnosis not present

## 2015-09-19 DIAGNOSIS — I10 Essential (primary) hypertension: Secondary | ICD-10-CM | POA: Diagnosis not present

## 2015-09-19 DIAGNOSIS — Z9181 History of falling: Secondary | ICD-10-CM | POA: Diagnosis not present

## 2015-09-19 DIAGNOSIS — W1839XD Other fall on same level, subsequent encounter: Secondary | ICD-10-CM | POA: Diagnosis not present

## 2015-09-19 DIAGNOSIS — M80851D Other osteoporosis with current pathological fracture, right femur, subsequent encounter for fracture with routine healing: Secondary | ICD-10-CM | POA: Diagnosis not present

## 2015-09-19 DIAGNOSIS — G8191 Hemiplegia, unspecified affecting right dominant side: Secondary | ICD-10-CM | POA: Diagnosis not present

## 2015-09-22 DIAGNOSIS — W1839XD Other fall on same level, subsequent encounter: Secondary | ICD-10-CM | POA: Diagnosis not present

## 2015-09-22 DIAGNOSIS — I10 Essential (primary) hypertension: Secondary | ICD-10-CM | POA: Diagnosis not present

## 2015-09-22 DIAGNOSIS — S069X0S Unspecified intracranial injury without loss of consciousness, sequela: Secondary | ICD-10-CM | POA: Diagnosis not present

## 2015-09-22 DIAGNOSIS — M80851D Other osteoporosis with current pathological fracture, right femur, subsequent encounter for fracture with routine healing: Secondary | ICD-10-CM | POA: Diagnosis not present

## 2015-09-22 DIAGNOSIS — Z9181 History of falling: Secondary | ICD-10-CM | POA: Diagnosis not present

## 2015-09-22 DIAGNOSIS — G8191 Hemiplegia, unspecified affecting right dominant side: Secondary | ICD-10-CM | POA: Diagnosis not present

## 2015-09-23 DIAGNOSIS — I10 Essential (primary) hypertension: Secondary | ICD-10-CM | POA: Diagnosis not present

## 2015-09-23 DIAGNOSIS — W1839XD Other fall on same level, subsequent encounter: Secondary | ICD-10-CM | POA: Diagnosis not present

## 2015-09-23 DIAGNOSIS — M80851D Other osteoporosis with current pathological fracture, right femur, subsequent encounter for fracture with routine healing: Secondary | ICD-10-CM | POA: Diagnosis not present

## 2015-09-23 DIAGNOSIS — Z9181 History of falling: Secondary | ICD-10-CM | POA: Diagnosis not present

## 2015-09-23 DIAGNOSIS — G8191 Hemiplegia, unspecified affecting right dominant side: Secondary | ICD-10-CM | POA: Diagnosis not present

## 2015-09-23 DIAGNOSIS — S069X0S Unspecified intracranial injury without loss of consciousness, sequela: Secondary | ICD-10-CM | POA: Diagnosis not present

## 2015-09-25 DIAGNOSIS — W1839XD Other fall on same level, subsequent encounter: Secondary | ICD-10-CM | POA: Diagnosis not present

## 2015-09-25 DIAGNOSIS — M80851D Other osteoporosis with current pathological fracture, right femur, subsequent encounter for fracture with routine healing: Secondary | ICD-10-CM | POA: Diagnosis not present

## 2015-09-25 DIAGNOSIS — Z9181 History of falling: Secondary | ICD-10-CM | POA: Diagnosis not present

## 2015-09-25 DIAGNOSIS — I10 Essential (primary) hypertension: Secondary | ICD-10-CM | POA: Diagnosis not present

## 2015-09-25 DIAGNOSIS — G8191 Hemiplegia, unspecified affecting right dominant side: Secondary | ICD-10-CM | POA: Diagnosis not present

## 2015-09-25 DIAGNOSIS — S069X0S Unspecified intracranial injury without loss of consciousness, sequela: Secondary | ICD-10-CM | POA: Diagnosis not present

## 2015-09-29 DIAGNOSIS — M80851D Other osteoporosis with current pathological fracture, right femur, subsequent encounter for fracture with routine healing: Secondary | ICD-10-CM | POA: Diagnosis not present

## 2015-09-29 DIAGNOSIS — G8191 Hemiplegia, unspecified affecting right dominant side: Secondary | ICD-10-CM | POA: Diagnosis not present

## 2015-09-29 DIAGNOSIS — W1839XD Other fall on same level, subsequent encounter: Secondary | ICD-10-CM | POA: Diagnosis not present

## 2015-09-29 DIAGNOSIS — I10 Essential (primary) hypertension: Secondary | ICD-10-CM | POA: Diagnosis not present

## 2015-09-29 DIAGNOSIS — Z9181 History of falling: Secondary | ICD-10-CM | POA: Diagnosis not present

## 2015-09-29 DIAGNOSIS — S069X0S Unspecified intracranial injury without loss of consciousness, sequela: Secondary | ICD-10-CM | POA: Diagnosis not present

## 2015-10-03 DIAGNOSIS — S069X0S Unspecified intracranial injury without loss of consciousness, sequela: Secondary | ICD-10-CM | POA: Diagnosis not present

## 2015-10-03 DIAGNOSIS — M80851D Other osteoporosis with current pathological fracture, right femur, subsequent encounter for fracture with routine healing: Secondary | ICD-10-CM | POA: Diagnosis not present

## 2015-10-03 DIAGNOSIS — Z9181 History of falling: Secondary | ICD-10-CM | POA: Diagnosis not present

## 2015-10-03 DIAGNOSIS — I10 Essential (primary) hypertension: Secondary | ICD-10-CM | POA: Diagnosis not present

## 2015-10-03 DIAGNOSIS — W1839XD Other fall on same level, subsequent encounter: Secondary | ICD-10-CM | POA: Diagnosis not present

## 2015-10-03 DIAGNOSIS — G8191 Hemiplegia, unspecified affecting right dominant side: Secondary | ICD-10-CM | POA: Diagnosis not present

## 2015-10-07 DIAGNOSIS — M80851D Other osteoporosis with current pathological fracture, right femur, subsequent encounter for fracture with routine healing: Secondary | ICD-10-CM | POA: Diagnosis not present

## 2015-10-07 DIAGNOSIS — S069X0S Unspecified intracranial injury without loss of consciousness, sequela: Secondary | ICD-10-CM | POA: Diagnosis not present

## 2015-10-07 DIAGNOSIS — Z9181 History of falling: Secondary | ICD-10-CM | POA: Diagnosis not present

## 2015-10-07 DIAGNOSIS — W1839XD Other fall on same level, subsequent encounter: Secondary | ICD-10-CM | POA: Diagnosis not present

## 2015-10-07 DIAGNOSIS — G8191 Hemiplegia, unspecified affecting right dominant side: Secondary | ICD-10-CM | POA: Diagnosis not present

## 2015-10-07 DIAGNOSIS — I10 Essential (primary) hypertension: Secondary | ICD-10-CM | POA: Diagnosis not present

## 2015-10-10 DIAGNOSIS — W1839XD Other fall on same level, subsequent encounter: Secondary | ICD-10-CM | POA: Diagnosis not present

## 2015-10-10 DIAGNOSIS — S069X0S Unspecified intracranial injury without loss of consciousness, sequela: Secondary | ICD-10-CM | POA: Diagnosis not present

## 2015-10-10 DIAGNOSIS — I10 Essential (primary) hypertension: Secondary | ICD-10-CM | POA: Diagnosis not present

## 2015-10-10 DIAGNOSIS — G8191 Hemiplegia, unspecified affecting right dominant side: Secondary | ICD-10-CM | POA: Diagnosis not present

## 2015-10-10 DIAGNOSIS — Z9181 History of falling: Secondary | ICD-10-CM | POA: Diagnosis not present

## 2015-10-10 DIAGNOSIS — M80851D Other osteoporosis with current pathological fracture, right femur, subsequent encounter for fracture with routine healing: Secondary | ICD-10-CM | POA: Diagnosis not present

## 2015-10-22 DIAGNOSIS — Z9181 History of falling: Secondary | ICD-10-CM | POA: Diagnosis not present

## 2015-10-22 DIAGNOSIS — M80851D Other osteoporosis with current pathological fracture, right femur, subsequent encounter for fracture with routine healing: Secondary | ICD-10-CM | POA: Diagnosis not present

## 2015-10-22 DIAGNOSIS — G8191 Hemiplegia, unspecified affecting right dominant side: Secondary | ICD-10-CM | POA: Diagnosis not present

## 2015-10-22 DIAGNOSIS — W1839XD Other fall on same level, subsequent encounter: Secondary | ICD-10-CM | POA: Diagnosis not present

## 2015-10-22 DIAGNOSIS — S069X0S Unspecified intracranial injury without loss of consciousness, sequela: Secondary | ICD-10-CM | POA: Diagnosis not present

## 2015-10-22 DIAGNOSIS — I10 Essential (primary) hypertension: Secondary | ICD-10-CM | POA: Diagnosis not present

## 2015-10-24 DIAGNOSIS — S069X0S Unspecified intracranial injury without loss of consciousness, sequela: Secondary | ICD-10-CM | POA: Diagnosis not present

## 2015-10-24 DIAGNOSIS — G8191 Hemiplegia, unspecified affecting right dominant side: Secondary | ICD-10-CM | POA: Diagnosis not present

## 2015-10-24 DIAGNOSIS — M80851D Other osteoporosis with current pathological fracture, right femur, subsequent encounter for fracture with routine healing: Secondary | ICD-10-CM | POA: Diagnosis not present

## 2015-10-24 DIAGNOSIS — I10 Essential (primary) hypertension: Secondary | ICD-10-CM | POA: Diagnosis not present

## 2015-10-24 DIAGNOSIS — Z9181 History of falling: Secondary | ICD-10-CM | POA: Diagnosis not present

## 2015-10-24 DIAGNOSIS — W1839XD Other fall on same level, subsequent encounter: Secondary | ICD-10-CM | POA: Diagnosis not present

## 2015-11-13 DIAGNOSIS — G8191 Hemiplegia, unspecified affecting right dominant side: Secondary | ICD-10-CM | POA: Diagnosis not present

## 2015-11-13 DIAGNOSIS — M80851D Other osteoporosis with current pathological fracture, right femur, subsequent encounter for fracture with routine healing: Secondary | ICD-10-CM | POA: Diagnosis not present

## 2015-11-13 DIAGNOSIS — I10 Essential (primary) hypertension: Secondary | ICD-10-CM | POA: Diagnosis not present

## 2015-11-13 DIAGNOSIS — Z9181 History of falling: Secondary | ICD-10-CM | POA: Diagnosis not present

## 2015-11-13 DIAGNOSIS — W1839XD Other fall on same level, subsequent encounter: Secondary | ICD-10-CM | POA: Diagnosis not present

## 2015-11-24 DIAGNOSIS — I10 Essential (primary) hypertension: Secondary | ICD-10-CM | POA: Diagnosis not present

## 2015-11-24 DIAGNOSIS — M80851D Other osteoporosis with current pathological fracture, right femur, subsequent encounter for fracture with routine healing: Secondary | ICD-10-CM | POA: Diagnosis not present

## 2015-11-24 DIAGNOSIS — W1839XD Other fall on same level, subsequent encounter: Secondary | ICD-10-CM | POA: Diagnosis not present

## 2015-11-24 DIAGNOSIS — Z9181 History of falling: Secondary | ICD-10-CM | POA: Diagnosis not present

## 2015-11-24 DIAGNOSIS — G8191 Hemiplegia, unspecified affecting right dominant side: Secondary | ICD-10-CM | POA: Diagnosis not present

## 2015-12-29 DIAGNOSIS — I10 Essential (primary) hypertension: Secondary | ICD-10-CM | POA: Diagnosis not present

## 2015-12-29 DIAGNOSIS — Z0000 Encounter for general adult medical examination without abnormal findings: Secondary | ICD-10-CM | POA: Diagnosis not present

## 2015-12-29 DIAGNOSIS — G8191 Hemiplegia, unspecified affecting right dominant side: Secondary | ICD-10-CM | POA: Diagnosis not present

## 2015-12-29 DIAGNOSIS — S069X9S Unspecified intracranial injury with loss of consciousness of unspecified duration, sequela: Secondary | ICD-10-CM | POA: Diagnosis not present

## 2015-12-29 DIAGNOSIS — E785 Hyperlipidemia, unspecified: Secondary | ICD-10-CM | POA: Diagnosis not present

## 2016-03-30 DIAGNOSIS — I10 Essential (primary) hypertension: Secondary | ICD-10-CM | POA: Diagnosis not present

## 2016-03-30 DIAGNOSIS — G8191 Hemiplegia, unspecified affecting right dominant side: Secondary | ICD-10-CM | POA: Diagnosis not present

## 2016-03-30 DIAGNOSIS — E785 Hyperlipidemia, unspecified: Secondary | ICD-10-CM | POA: Diagnosis not present

## 2016-03-30 DIAGNOSIS — S069X9S Unspecified intracranial injury with loss of consciousness of unspecified duration, sequela: Secondary | ICD-10-CM | POA: Diagnosis not present

## 2016-05-18 DIAGNOSIS — I10 Essential (primary) hypertension: Secondary | ICD-10-CM | POA: Diagnosis not present

## 2016-06-16 ENCOUNTER — Telehealth: Payer: Self-pay

## 2016-06-16 NOTE — Telephone Encounter (Signed)
I spoke with the patient to confirm his PCP.  He stated that he sees Dr. Sherri Rad at [facility] (PCP) and Dr. Jennings Books for neurology. Roy Ellison ([place])

## 2016-07-20 DIAGNOSIS — S069X9S Unspecified intracranial injury with loss of consciousness of unspecified duration, sequela: Secondary | ICD-10-CM | POA: Diagnosis not present

## 2016-07-20 DIAGNOSIS — G8111 Spastic hemiplegia affecting right dominant side: Secondary | ICD-10-CM | POA: Diagnosis not present

## 2016-08-11 DIAGNOSIS — H4921 Sixth [abducent] nerve palsy, right eye: Secondary | ICD-10-CM | POA: Diagnosis not present

## 2016-08-17 DIAGNOSIS — G8191 Hemiplegia, unspecified affecting right dominant side: Secondary | ICD-10-CM | POA: Diagnosis not present

## 2016-08-17 DIAGNOSIS — E785 Hyperlipidemia, unspecified: Secondary | ICD-10-CM | POA: Diagnosis not present

## 2016-08-17 DIAGNOSIS — I10 Essential (primary) hypertension: Secondary | ICD-10-CM | POA: Diagnosis not present

## 2016-08-17 DIAGNOSIS — Z0000 Encounter for general adult medical examination without abnormal findings: Secondary | ICD-10-CM | POA: Diagnosis not present

## 2016-08-17 DIAGNOSIS — S069X9S Unspecified intracranial injury with loss of consciousness of unspecified duration, sequela: Secondary | ICD-10-CM | POA: Diagnosis not present

## 2016-09-10 DIAGNOSIS — Z0000 Encounter for general adult medical examination without abnormal findings: Secondary | ICD-10-CM | POA: Diagnosis not present

## 2016-10-28 ENCOUNTER — Telehealth: Payer: Self-pay

## 2016-10-28 NOTE — Telephone Encounter (Signed)
Patient on quality measure schedule visit list. Never seen by [facility]. Has [facility]. Spoke with pt, he goes to [facility]. Told him to check with his insurance to make sure we are not listed as his PCP.

## 2016-12-01 DIAGNOSIS — H53461 Homonymous bilateral field defects, right side: Secondary | ICD-10-CM | POA: Diagnosis not present

## 2016-12-01 DIAGNOSIS — H4921 Sixth [abducent] nerve palsy, right eye: Secondary | ICD-10-CM | POA: Diagnosis not present

## 2019-11-29 ENCOUNTER — Other Ambulatory Visit: Admission: RE | Admit: 2019-11-29 | Payer: Medicare Other | Source: Ambulatory Visit

## 2019-11-30 ENCOUNTER — Other Ambulatory Visit: Payer: Self-pay

## 2019-11-30 ENCOUNTER — Encounter: Payer: Self-pay | Admitting: Internal Medicine

## 2019-11-30 ENCOUNTER — Other Ambulatory Visit
Admission: RE | Admit: 2019-11-30 | Discharge: 2019-11-30 | Disposition: A | Discharge status: Home / Self Care | Payer: Medicare Other | Source: Ambulatory Visit | Attending: Internal Medicine | Admitting: Internal Medicine

## 2019-11-30 DIAGNOSIS — Z01812 Encounter for preprocedural laboratory examination: Secondary | ICD-10-CM | POA: Diagnosis present

## 2019-11-30 DIAGNOSIS — Z20822 Contact with and (suspected) exposure to covid-19: Secondary | ICD-10-CM | POA: Diagnosis not present

## 2019-11-30 LAB — SARS CORONAVIRUS 2 (TAT 6-24 HRS): SARS Coronavirus 2: NEGATIVE

## 2019-12-03 ENCOUNTER — Ambulatory Visit
Admission: RE | Admit: 2019-12-03 | Discharge: 2019-12-03 | Disposition: A | Discharge status: Home / Self Care | Payer: Medicare Other | Attending: Internal Medicine | Admitting: Internal Medicine

## 2019-12-03 ENCOUNTER — Encounter
Admission: RE | Disposition: A | Discharge status: Home / Self Care | Payer: Self-pay | Source: Home / Self Care | Attending: Internal Medicine

## 2019-12-03 ENCOUNTER — Ambulatory Visit: Payer: Medicare Other | Admitting: Anesthesiology

## 2019-12-03 ENCOUNTER — Other Ambulatory Visit: Payer: Self-pay

## 2019-12-03 DIAGNOSIS — D128 Benign neoplasm of rectum: Secondary | ICD-10-CM | POA: Insufficient documentation

## 2019-12-03 DIAGNOSIS — Z79899 Other long term (current) drug therapy: Secondary | ICD-10-CM | POA: Diagnosis not present

## 2019-12-03 DIAGNOSIS — K640 First degree hemorrhoids: Secondary | ICD-10-CM | POA: Diagnosis not present

## 2019-12-03 DIAGNOSIS — E785 Hyperlipidemia, unspecified: Secondary | ICD-10-CM | POA: Insufficient documentation

## 2019-12-03 DIAGNOSIS — D123 Benign neoplasm of transverse colon: Secondary | ICD-10-CM | POA: Insufficient documentation

## 2019-12-03 DIAGNOSIS — I10 Essential (primary) hypertension: Secondary | ICD-10-CM | POA: Diagnosis not present

## 2019-12-03 DIAGNOSIS — R195 Other fecal abnormalities: Secondary | ICD-10-CM | POA: Diagnosis present

## 2019-12-03 HISTORY — PX: COLONOSCOPY WITH PROPOFOL: SHX5780

## 2019-12-03 SURGERY — COLONOSCOPY WITH PROPOFOL
Anesthesia: General

## 2019-12-03 MED ORDER — LIDOCAINE HCL (CARDIAC) PF 100 MG/5ML IV SOSY
PREFILLED_SYRINGE | INTRAVENOUS | Status: DC | PRN
  Administered 2019-12-03: 40 mg via INTRAVENOUS

## 2019-12-03 MED ORDER — EPHEDRINE 5 MG/ML INJ
INTRAVENOUS | Status: AC
  Filled 2019-12-03: qty 10

## 2019-12-03 MED ORDER — LIDOCAINE HCL (PF) 2 % IJ SOLN
INTRAMUSCULAR | Status: AC
  Filled 2019-12-03: qty 5

## 2019-12-03 MED ORDER — PROPOFOL 500 MG/50ML IV EMUL
INTRAVENOUS | Status: DC | PRN
  Administered 2019-12-03: 125 ug/kg/min via INTRAVENOUS

## 2019-12-03 MED ORDER — PROPOFOL 500 MG/50ML IV EMUL
INTRAVENOUS | Status: AC
  Filled 2019-12-03: qty 50

## 2019-12-03 MED ORDER — EPHEDRINE SULFATE 50 MG/ML IJ SOLN
INTRAMUSCULAR | Status: DC | PRN
  Administered 2019-12-03: 10 mg via INTRAVENOUS

## 2019-12-03 MED ORDER — SODIUM CHLORIDE 0.9 % IV SOLN
INTRAVENOUS | Status: DC
  Administered 2019-12-03: 20 mL/h via INTRAVENOUS

## 2019-12-03 MED ORDER — PROPOFOL 10 MG/ML IV BOLUS
INTRAVENOUS | Status: DC | PRN
  Administered 2019-12-03: 80 mg via INTRAVENOUS
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
## 2019-12-03 NOTE — Anesthesia Postprocedure Evaluation (Signed)
Anesthesia Post Note  Patient: Roy Ellison  Procedure(s) Performed: COLONOSCOPY WITH PROPOFOL (N/A )  Patient location during evaluation: Endoscopy Anesthesia Type: General Level of consciousness: awake and alert Pain management: pain level controlled Vital Signs Assessment: post-procedure vital signs reviewed and stable Respiratory status: spontaneous breathing and respiratory function stable Cardiovascular status: stable Anesthetic complications: no   No complications documented.   Last Vitals:  Vitals:   12/03/19 0916 12/03/19 1109  BP: (!) 160/85 (!) 113/56  Pulse: 79 (!) 56  Resp: 20 19  Temp: (!) 36.4 C (!) 35.9 C  SpO2: 98% 96%    Last Pain:  Vitals:   12/03/19 1119  TempSrc:   PainSc: 0-No pain                 [name]

## 2019-12-03 NOTE — Transfer of Care (Signed)
Immediate Anesthesia Transfer of Care Note  Patient: Roy Ellison  Procedure(s) Performed: Procedure(s): COLONOSCOPY WITH PROPOFOL (N/A)  Patient Location: PACU and Endoscopy Unit  Anesthesia Type:General  Level of Consciousness: sedated  Airway & Oxygen Therapy: Patient Spontanous Breathing and Patient connected to nasal cannula oxygen  Post-op Assessment: Report given to RN and Post -op Vital signs reviewed and stable  Post vital signs: Reviewed and stable  Last Vitals:  Vitals:   12/03/19 0916 12/03/19 1109  BP: (!) 160/85 (!) 113/56  Pulse: 79 (!) 56  Resp: 20 19  Temp: (!) 36.4 C (!) 35.9 C  SpO2: 02% 54%    Complications: No apparent anesthesia complications

## 2019-12-03 NOTE — Anesthesia Procedure Notes (Signed)
Date/Time: 12/03/2019 11:39 AM Performed by: Doreen Salvage, CRNA Pre-anesthesia Checklist: Patient identified, Emergency Drugs available, Suction available and Patient being monitored Patient Re-evaluated:Patient Re-evaluated prior to induction Oxygen Delivery Method: Nasal cannula Induction Type: IV induction Dental Injury: Teeth and Oropharynx as per pre-operative assessment  Comments: Nasal cannula with etCO2 monitoring

## 2019-12-03 NOTE — Interval H&P Note (Signed)
History and Physical Interval Note:  12/03/2019 10:23 AM  Roy Ellison  has presented today for surgery, with the diagnosis of positive colorecal screening.  The various methods of treatment have been discussed with the patient and family. After consideration of risks, benefits and other options for treatment, the patient has consented to  Procedure(s): COLONOSCOPY WITH PROPOFOL (N/A) as a surgical intervention.  The patient's history has been reviewed, patient examined, no change in status, stable for surgery.  I have reviewed the patient's chart and labs.  Questions were answered to the patient's satisfaction.     [place], [place]

## 2019-12-03 NOTE — Op Note (Signed)
[facility] Patient Name: Roy Ellison Procedure Date: 12/03/2019 10:27 AM MRN: [id number] Account #: [id number] Date of Birth: 14-Dec-1965 Admit Type: Outpatient Age: 54 Room: [facility] ENDO ROOM 3 Gender: Male Note Status: Finalized Procedure:             Colonoscopy Indications:           Positive Cologuard test Providers:             Benay Pike. Alice Reichert MD, MD Referring MD:          Ocie Cornfield. Ouida Sills MD, MD (Referring MD) Medicines:             Propofol per Anesthesia Complications:         No immediate complications. Procedure:             Pre-Anesthesia Assessment:                        - The risks and benefits of the procedure and the                         sedation options and risks were discussed with the                         patient. All questions were answered and informed                         consent was obtained.                        - Patient identification and proposed procedure were                         verified prior to the procedure by the nurse. The                         procedure was verified in the procedure room.                        - ASA Grade Assessment: III - A patient with severe                         systemic disease.                        - After reviewing the risks and benefits, the patient                         was deemed in satisfactory condition to undergo the                         procedure.                        After obtaining informed consent, the colonoscope was                         passed under direct vision. Throughout the procedure,                         the patient's blood pressure, pulse,  and oxygen                         saturations were monitored continuously. The                         Colonoscope was introduced through the anus and                         advanced to the the cecum, identified by appendiceal                         orifice and ileocecal valve. The  colonoscopy was                         performed without difficulty. The patient tolerated                         the procedure well. The quality of the bowel                         preparation was good. The ileocecal valve, appendiceal                         orifice, and rectum were photographed. Findings:      The perianal and digital rectal examinations were normal. Pertinent       negatives include normal sphincter tone and no palpable rectal lesions.      A 10 mm polyp was found in the rectum. The polyp was semi-pedunculated.       The polyp was removed with a hot snare. Resection and retrieval were       complete.      A 5 mm polyp was found in the transverse colon. The polyp was sessile.       The polyp was removed with a jumbo cold forceps. Resection and retrieval       were complete.      Non-bleeding internal hemorrhoids were found during retroflexion. The       hemorrhoids were Grade I (internal hemorrhoids that do not prolapse).      The exam was otherwise without abnormality. Impression:            - One 10 mm polyp in the rectum, removed with a hot                         snare. Resected and retrieved.                        - One 5 mm polyp in the transverse colon, removed with                         a jumbo cold forceps. Resected and retrieved.                        - Non-bleeding internal hemorrhoids.                        - The examination was otherwise normal. Recommendation:        - Patient has a contact number available for  emergencies. The signs and symptoms of potential                         delayed complications were discussed with the patient.                         Return to normal activities tomorrow. Written                         discharge instructions were provided to the patient.                        - Resume previous diet.                        - Continue present medications.                        - Repeat  colonoscopy is recommended for surveillance.                         The colonoscopy date will be determined after                         pathology results from today's exam become available                         for review.                        - Return to GI clinic PRN.                        - The findings and recommendations were discussed with                         the patient. Procedure Code(s):     --- Professional ---                        [phone], Colonoscopy, flexible; with removal of                         tumor(s), polyp(s), or other lesion(s) by snare                         technique                        45380, 59, Colonoscopy, flexible; with biopsy, single                         or multiple Diagnosis Code(s):     --- Professional ---                        R19.5, Other fecal abnormalities                        K64.0, First degree hemorrhoids                        K63.5, Polyp of colon  K62.1, Rectal polyp CPT copyright 2019 American [employer] Association. All rights reserved. The codes documented in this report are preliminary and upon coder review may  be revised to meet current compliance requirements. Efrain Sella MD, MD 12/03/2019 11:05:57 AM This report has been signed electronically. Number of Addenda: 0 Note Initiated On: 12/03/2019 10:27 AM Scope Withdrawal Time: 0 hours 10 minutes 2 seconds  Total Procedure Duration: 0 hours 15 minutes 11 seconds  Estimated Blood Loss:  Estimated blood loss: none.      [facility]

## 2019-12-03 NOTE — H&P (Signed)
Outpatient short stay form Pre-procedure 12/03/2019 10:21 AM Donesha Wallander K. Alice Reichert, M.D.  Primary Physician: Frazier Richards, M.D.  Reason for visit:  Positive Cologuard  History of present illness: Patient is a pleasant 54 y/o male presenting on referral from primary care provider for a POSITIVE Cologuard result. Patient denies change in bowel habits, rectal bleeding, weight loss or abdominal pain.      Current Facility-Administered Medications:  .  0.9 %  sodium chloride infusion, , Intravenous, Continuous, [place], Benay Pike, MD, Last Rate: 20 mL/hr at 12/03/19 0936, 20 mL/hr at 12/03/19 0936  Medications Prior to Admission  Medication Sig Dispense Refill Last Dose  . calcium carbonate (OSCAL) 1500 (600 Ca) MG TABS tablet Take 600 mg of elemental calcium by mouth daily.   12/02/2019 at Unknown time  . carvedilol (COREG) 25 MG tablet Take 25 mg by mouth 2 (two) times daily with a meal.   12/03/2019 at 0800  . ibuprofen (ADVIL,MOTRIN) 200 MG tablet Take 200 mg by mouth every 6 (six) hours as needed.   Past Week at Unknown time  . mirtazapine (REMERON) 30 MG tablet Take 1 tablet by mouth at bedtime as needed.   12/02/2019 at Unknown time  . rosuvastatin (CRESTOR) 20 MG tablet Take 20 mg by mouth daily.   12/02/2019 at Unknown time     No Known Allergies   Past Medical History:  Diagnosis Date  . 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 Hyperlipidemia   . Hypertension     Review of systems:  Otherwise negative.    Physical Exam  Gen: Alert, oriented. Appears stated age.  HEENT: [place]/AT. PERRLA. Lungs: CTA, no wheezes. CV: RR nl S1, S2. Abd: soft, benign, no masses. BS+ Ext: No edema. Pulses 2+    Planned procedures: Proceed with colonoscopy. The patient understands the nature of the planned procedure, indications, risks, alternatives and potential complications including but not limited to bleeding, infection, perforation, damage to internal organs and possible oversedation/side effects from  anesthesia. The patient agrees and gives consent to proceed.  Please refer to procedure notes for findings, recommendations and patient disposition/instructions.     Vimal Derego K. Alice Reichert, M.D. Gastroenterology 12/03/2019  10:21 AM

## 2019-12-03 NOTE — Interval H&P Note (Signed)
History and Physical Interval Note:  12/03/2019 10:23 AM  Roy Ellison  has presented today for surgery, with the diagnosis of positive colorecal screening.  The various methods of treatment have been discussed with the patient and family. After consideration of risks, benefits and other options for treatment, the patient has consented to  Procedure(s): COLONOSCOPY WITH PROPOFOL (N/A) as a surgical intervention.  The patient's history has been reviewed, patient examined, no change in status, stable for surgery.  I have reviewed the patient's chart and labs.  Questions were answered to the patient's satisfaction.     [place], [place]

## 2019-12-04 ENCOUNTER — Encounter: Payer: Self-pay | Admitting: Internal Medicine

## 2019-12-04 LAB — SURGICAL PATHOLOGY

## 2021-02-05 ENCOUNTER — Emergency Department
Admission: EM | Admit: 2021-02-05 | Discharge: 2021-02-05 | Disposition: A | Discharge status: Home / Self Care | Payer: Medicare Other | Attending: Emergency Medicine | Admitting: Emergency Medicine

## 2021-02-05 ENCOUNTER — Emergency Department: Payer: Medicare Other

## 2021-02-05 ENCOUNTER — Other Ambulatory Visit: Payer: Self-pay

## 2021-02-05 DIAGNOSIS — I10 Essential (primary) hypertension: Secondary | ICD-10-CM | POA: Diagnosis not present

## 2021-02-05 DIAGNOSIS — H5789 Other specified disorders of eye and adnexa: Secondary | ICD-10-CM | POA: Diagnosis present

## 2021-02-05 DIAGNOSIS — H1131 Conjunctival hemorrhage, right eye: Secondary | ICD-10-CM | POA: Diagnosis not present

## 2021-02-05 DIAGNOSIS — Z20822 Contact with and (suspected) exposure to covid-19: Secondary | ICD-10-CM | POA: Insufficient documentation

## 2021-02-05 DIAGNOSIS — Z79899 Other long term (current) drug therapy: Secondary | ICD-10-CM | POA: Insufficient documentation

## 2021-02-05 DIAGNOSIS — R519 Headache, unspecified: Secondary | ICD-10-CM | POA: Diagnosis not present

## 2021-02-05 DIAGNOSIS — W228XXA Striking against or struck by other objects, initial encounter: Secondary | ICD-10-CM | POA: Insufficient documentation

## 2021-02-05 DIAGNOSIS — H539 Unspecified visual disturbance: Secondary | ICD-10-CM

## 2021-02-05 LAB — CBC
HCT: 47.9 % (ref 39.0–52.0)
Hemoglobin: 16.7 g/dL (ref 13.0–17.0)
MCH: 29.6 pg (ref 26.0–34.0)
MCHC: 34.9 g/dL (ref 30.0–36.0)
MCV: 84.9 fL (ref 80.0–100.0)
Platelets: 147 10*3/uL — ABNORMAL LOW (ref 150–400)
RBC: 5.64 MIL/uL (ref 4.22–5.81)
RDW: 13.1 % (ref 11.5–15.5)
WBC: 7 10*3/uL (ref 4.0–10.5)
nRBC: 0 % (ref 0.0–0.2)

## 2021-02-05 LAB — COMPREHENSIVE METABOLIC PANEL
ALT: 26 U/L (ref 0–44)
AST: 29 U/L (ref 15–41)
Albumin: 4.2 g/dL (ref 3.5–5.0)
Alkaline Phosphatase: 68 U/L (ref 38–126)
Anion gap: 7 (ref 5–15)
BUN: 10 mg/dL (ref 6–20)
CO2: 26 mmol/L (ref 22–32)
Calcium: 8.7 mg/dL — ABNORMAL LOW (ref 8.9–10.3)
Chloride: 101 mmol/L (ref 98–111)
Creatinine, Ser: 0.84 mg/dL (ref 0.61–1.24)
GFR, Estimated: >60 mL/min (ref >60)
Glucose, Bld: 132 mg/dL — ABNORMAL HIGH (ref 70–99)
Potassium: 3.6 mmol/L (ref 3.5–5.1)
Sodium: 134 mmol/L — ABNORMAL LOW (ref 135–145)
Total Bilirubin: 1.6 mg/dL — ABNORMAL HIGH (ref 0.3–1.2)
Total Protein: 7.7 g/dL (ref 6.5–8.1)

## 2021-02-05 LAB — RESP PANEL BY RT-PCR (FLU A&B, COVID) ARPGX2
Influenza A by PCR: NEGATIVE
Influenza B by PCR: NEGATIVE
SARS Coronavirus 2 by RT PCR: NEGATIVE

## 2021-02-05 LAB — URINE DRUG SCREEN, QUALITATIVE (ARMC ONLY)
Amphetamines, Ur Screen: NOT DETECTED
Barbiturates, Ur Screen: NOT DETECTED
Benzodiazepine, Ur Scrn: NOT DETECTED
Cannabinoid 50 Ng, Ur ~~LOC~~: NOT DETECTED
Cocaine Metabolite,Ur ~~LOC~~: NOT DETECTED
MDMA (Ecstasy)Ur Screen: NOT DETECTED
Methadone Scn, Ur: NOT DETECTED
Opiate, Ur Screen: NOT DETECTED
Phencyclidine (PCP) Ur S: NOT DETECTED
Tricyclic, Ur Screen: NOT DETECTED

## 2021-02-05 LAB — DIFFERENTIAL
Abs Immature Granulocytes: 0.02 10*3/uL (ref 0.00–0.07)
Basophils Absolute: 0.1 10*3/uL (ref 0.0–0.1)
Basophils Relative: 1 %
Eosinophils Absolute: 0.3 10*3/uL (ref 0.0–0.5)
Eosinophils Relative: 4 %
Immature Granulocytes: 0 %
Lymphocytes Relative: 33 %
Lymphs Abs: 2.3 10*3/uL (ref 0.7–4.0)
Monocytes Absolute: 0.5 10*3/uL (ref 0.1–1.0)
Monocytes Relative: 8 %
Neutro Abs: 3.8 10*3/uL (ref 1.7–7.7)
Neutrophils Relative %: 54 %

## 2021-02-05 LAB — URINALYSIS, ROUTINE W REFLEX MICROSCOPIC
Bacteria, UA: NONE SEEN
Bilirubin Urine: NEGATIVE
Glucose, UA: NEGATIVE mg/dL
Hgb urine dipstick: NEGATIVE
Ketones, ur: NEGATIVE mg/dL
Nitrite: NEGATIVE
Protein, ur: NEGATIVE mg/dL
Specific Gravity, Urine: 1.021 (ref 1.005–1.030)
pH: 5 (ref 5.0–8.0)

## 2021-02-05 LAB — PROTIME-INR
INR: 1.2 (ref 0.8–1.2)
Prothrombin Time: 14.9 seconds (ref 11.4–15.2)

## 2021-02-05 LAB — ETHANOL: Alcohol, Ethyl (B): <10 mg/dL (ref <10)

## 2021-02-05 LAB — APTT: aPTT: 33 seconds (ref 24–36)

## 2021-02-05 MED ORDER — FLUORESCEIN SODIUM 1 MG OP STRP
1.0000 | ORAL_STRIP | Freq: Once | OPHTHALMIC | Status: AC
  Administered 2021-02-05: 1 via OPHTHALMIC
  Filled 2021-02-05: qty 1

## 2021-02-05 MED ORDER — TETRACAINE HCL 0.5 % OP SOLN
1.0000 [drp] | Freq: Once | OPHTHALMIC | Status: AC
  Administered 2021-02-05: 1 [drp] via OPHTHALMIC
  Filled 2021-02-05: qty 4

## 2021-02-05 NOTE — Consult Note (Signed)
[facility] Telemedicine Consult   Requesting Provider: Iva Lento Consult Participants: Patient, bedside nurse Location of the provider: [facility] Location of the patient: [facility]  This consult was provided via telemedicine with 2-way video and audio communication. The patient/family was informed that care would be provided in this way and agreed to receive care in this manner.    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  He was walking to his bedroom after having laid down for several hours watching TV when he bumped into something on his right side, which encountered his right eye.  Since that time he has had some throbbing and senses a visual change.  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  After bumping into    LKW: unclear tpa given?: No, unclear time of onset IR Thrombectomy? No, mild new deficits Time of teleneurologist evaluation: 18:46 Time of notification: 18:43 Time NIHSS completed: 19:01  Exam: Vitals:   02/05/21 1830 02/05/21 1845  BP: (!) 160/73   Pulse:  (!) 51  Resp:    Temp:    SpO2: 98% 98%    General: In bed, no apparent distress Right eye is injected  1A: Level of Consciousness - 0 1B: Ask Month and Age - 0 1C: 'Blink Eyes' & 'Squeeze Hands' - 0 2: Test Horizontal Extraocular Movements - 0 3: Test Visual Fields - 2 4: Test Facial Palsy - 1 5A: Test Left Arm Motor Drift - 0 5B: Test Right Arm Motor Drift - 3 6A: Test Left Leg Motor Drift - 0 6B: Test Right Leg Motor Drift - 1 7: Test Limb Ataxia - 2(left amr and leg) 8: Test Sensation - 0 9: Test Language/Aphasia- 0 10: Test Dysarthria - 1 11: Test Extinction/Inattention - 0 NIHSS score: 9   Imaging Reviewed: CT head-no acute intracranial findings  Labs reviewed in epic and pertinent values  follow:    Assessment: 55 year old male with a history of previous TBI with right hemiparesis who presents with subjective visual change after bumping into something in his right eye.  I am not sure if he really has much in the way of new deficits, but given his complaints, I think an MRI is certainly indicated.  If MRI is negative, however, I would consider ophthalmological evaluation.  Recommendations: 1) MRI brain 2) if it reveals acute ischemic infarct, would pursue stroke work-up, otherwise would have him follow-up with ophthalmology.    This patient is receiving care for possible acute neurological changes. There was 43 minutes of care by this provider at the time of service, including time for direct evaluation via telemedicine, review of medical records, imaging studies and discussion of findings with providers, the patient and/or family.  Roland Rack, MD [facility] [phone]  If 7pm- 7am, please page neurology on call as listed in [place].
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
## 2021-02-05 NOTE — ED Notes (Signed)
Called [facility], Joelene Millin  to activate code stroke  [phone]

## 2021-02-05 NOTE — Discharge Instructions (Addendum)
Please follow up with ophthalmology tomorrow. Please seek medical attention for any high fevers, chest pain, shortness of breath, change in behavior, persistent vomiting, bloody stool or any other new or concerning symptoms.
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
## 2021-02-05 NOTE — ED Notes (Signed)
Called [facility], Joelene Millin  to activate code stroke  per Dr. Archie Balboa 1840

## 2021-02-05 NOTE — ED Notes (Signed)
Pt placed in lobby wife notified and on the way.

## 2021-02-06 LAB — URINE CULTURE: Culture: <10000 — AB

## 2022-05-31 IMAGING — MR MR HEAD W/O CM
10 series · 48 of 48 positions shown · non-contrast
Comparison: None.

CLINICAL DATA: Right-sided vision loss

EXAM:
MRI HEAD WITHOUT CONTRAST
TECHNIQUE: Multiplanar, multiecho pulse sequences of the brain and surrounding
structures were obtained without intravenous contrast.

[Series 5: ax dwi_tracew · axial · 3.0mm · 0.65mm/px · z∈[-62,+93]mm · 3 of 48 slices shown]
[im 1/48]
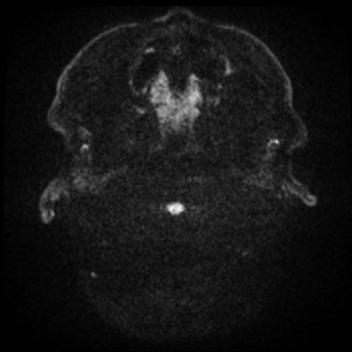
[im 24/48]
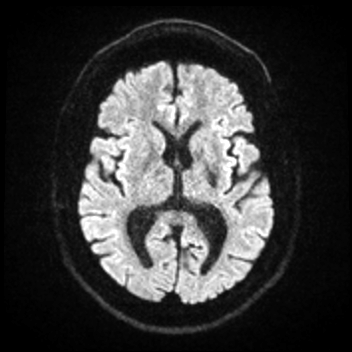
[im 48/48]
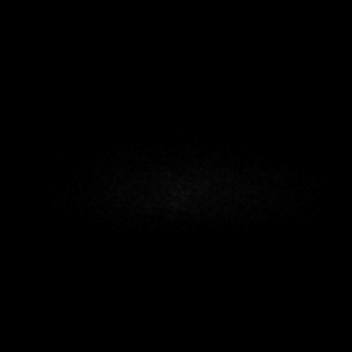

[Series 6: ax dwi_adc · axial · 3.0mm · 0.65mm/px · z∈[-62,+89]mm · 4 of 47 slices shown]
[im 1/47]
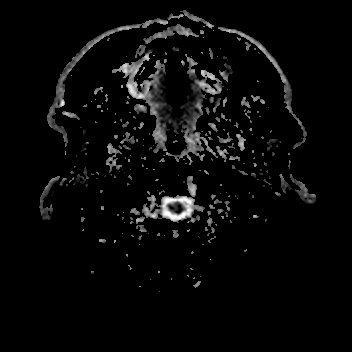
[im 16/47]
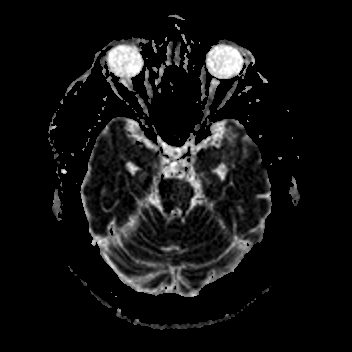
[im 31/47]
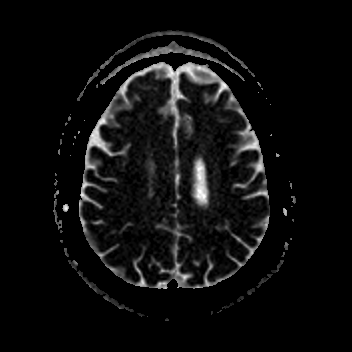
[im 47/47]
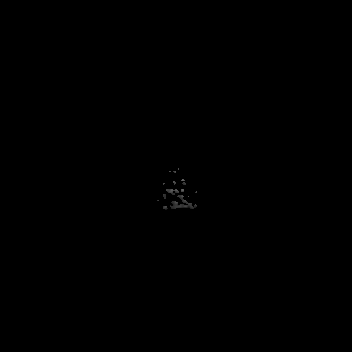

[Series 7: cor dwi_tracew · coronal · 5.0mm · 1.31mm/px · 3 of 38 slices shown]
[im 1/38]
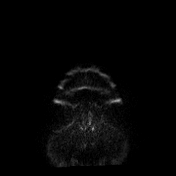
[im 19/38]
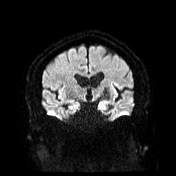
[im 38/38]
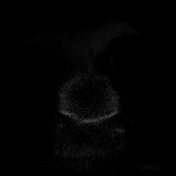

[Series 8: cor dwi_adc · coronal · 5.0mm · 1.31mm/px · 3 of 38 slices shown]
[im 1/38]
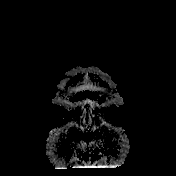
[im 19/38]
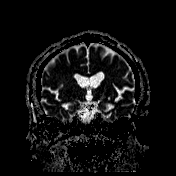
[im 38/38]
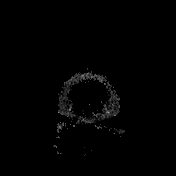

[Series 10: T2 · axial · 5.0mm · 0.53mm/px · z∈[-59,+90]mm · 2 of 26 slices shown (1 of 2)]
[im 1/26]
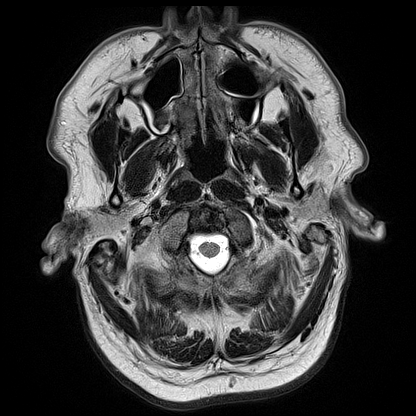
[im 26/26]
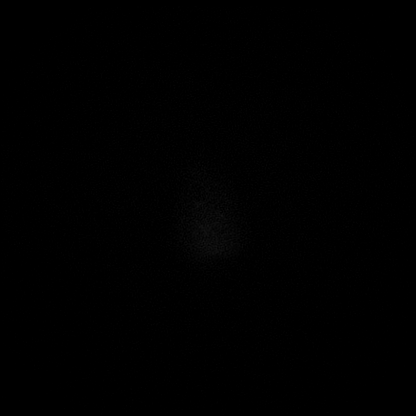

[Series 12: pha_images · axial · 3.0mm · 0.90mm/px · z∈[-71,+97]mm · 5 of 54 slices shown]
[im 1/54]
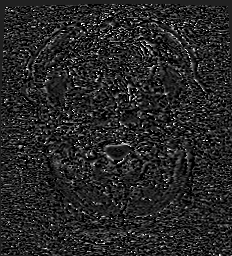
[im 14/54]
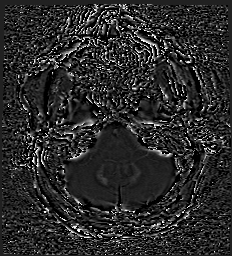
[im 27/54]
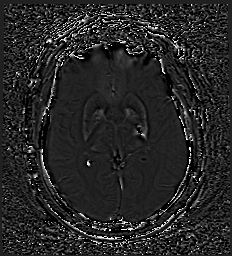
[im 40/54]
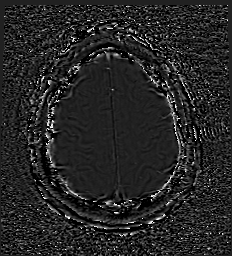
[im 54/54]
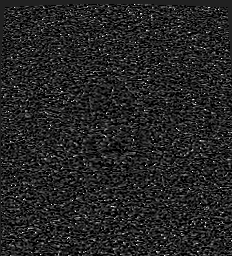

[Series 13: swi_images · axial · 3.0mm · 0.90mm/px · z∈[-74,+103]mm · 5 of 60 slices shown]
[im 1/60]
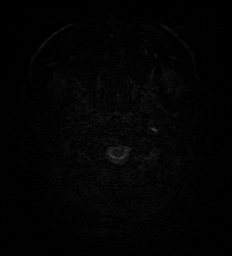
[im 15/60]
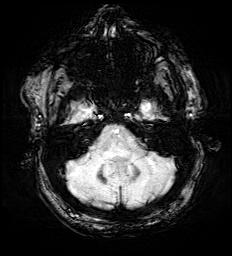
[im 30/60]
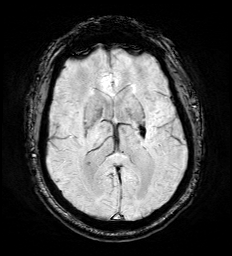
[im 45/60]
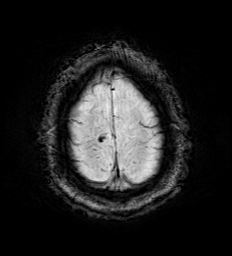
[im 60/60]
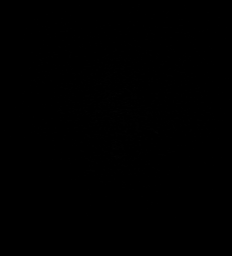

[Series 15: FLAIR · axial · 3.0mm · 0.53mm/px · z∈[-65,+96]mm · 5 of 55 slices shown]
[im 1/55]
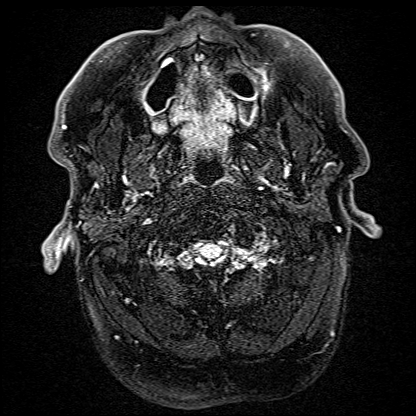
[im 14/55]
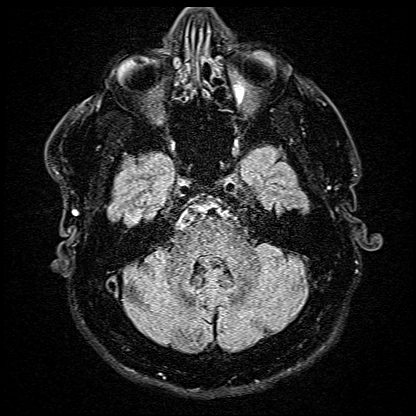
[im 28/55]
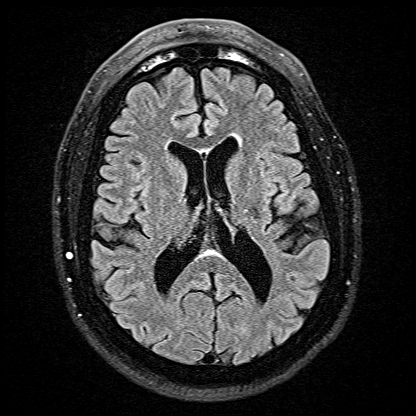
[im 41/55]
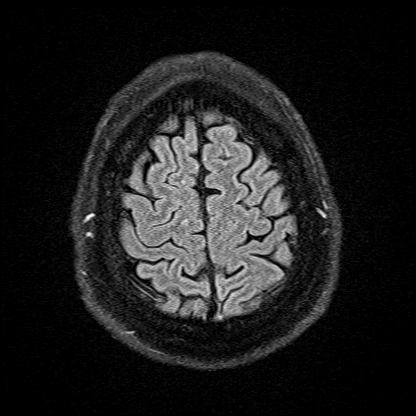
[im 55/55]
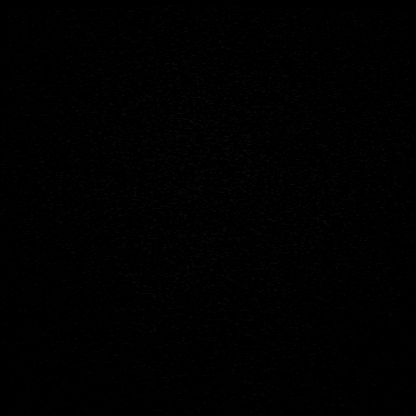

[Series 16: T1 · axial · 1.0mm · 0.98mm/px · z∈[-72,+100]mm · 15 of 172 slices shown]
[im 1/172]
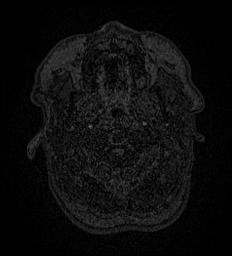
[im 13/172]
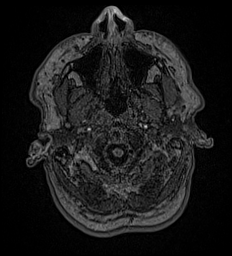
[im 25/172]
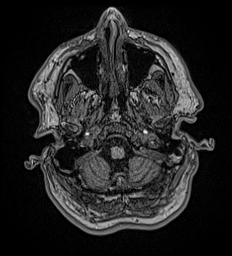
[im 37/172]
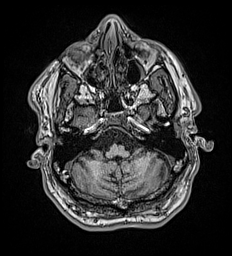
[im 49/172]
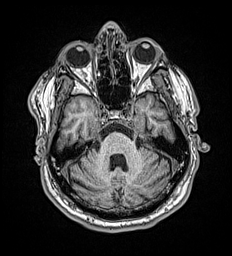
[im 62/172]
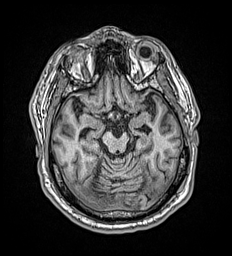
[im 74/172]
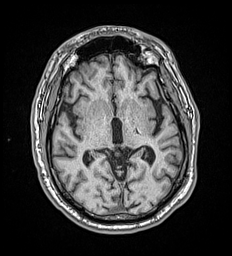
[im 86/172]
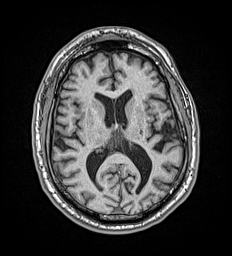
[im 98/172]
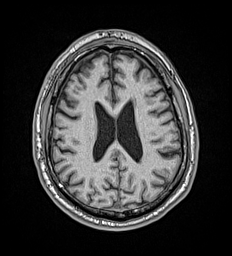
[im 110/172]
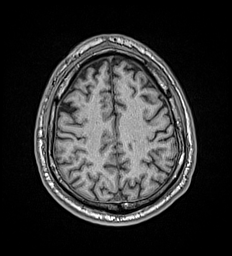
[im 123/172]
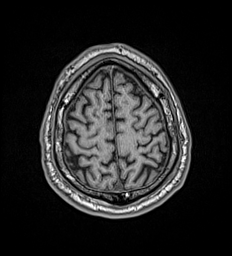
[im 135/172]
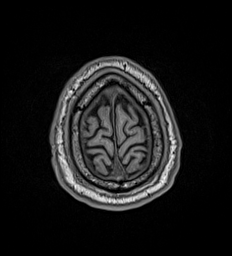
[im 147/172]
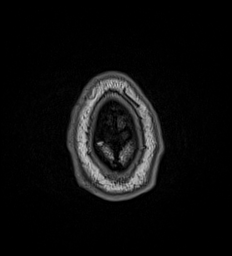
[im 159/172]
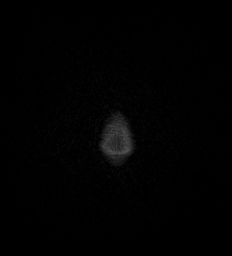
[im 172/172]
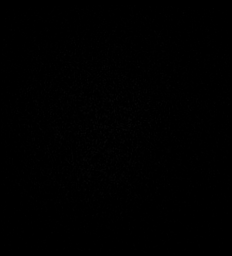

[Series 17: T2 · coronal · 5.0mm · 0.57mm/px · 3 of 31 slices shown (2 of 2)]
[im 1/31]
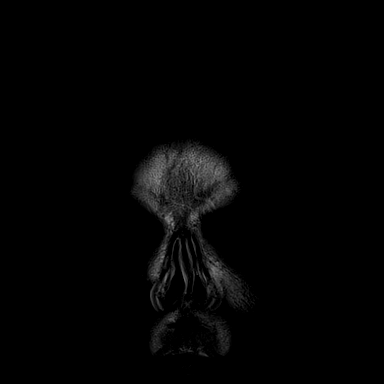
[im 16/31]
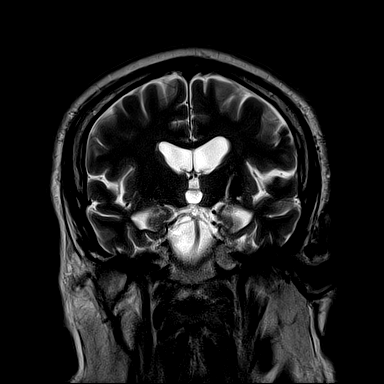
[im 31/31]
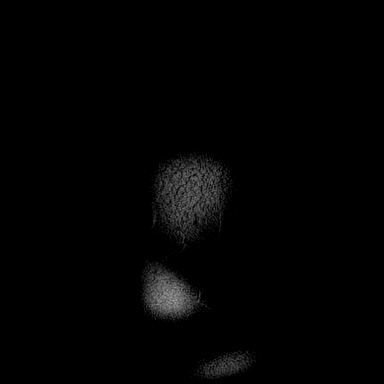

[48 of 48 positions shown; findings below may reference images not displayed]

FINDINGS: Brain: No acute infarct, mass effect or extra-axial collection.
Chronic hemorrhage at the left basal ganglia. There is multifocal
hyperintense T2-weighted signal within the white matter. Generalized
volume loss without a clear lobar predilection. The midline
structures are normal.

Vascular: Major flow voids are preserved.

Skull and upper cervical spine: Normal calvarium and skull base.
Visualized upper cervical spine and soft tissues are normal.

Sinuses/Orbits:No paranasal sinus fluid levels or advanced mucosal
thickening. No mastoid or middle ear effusion. Normal orbits.
IMPRESSION: 1. No acute intracranial abnormality.
2. Chronic hemorrhage at the left basal ganglia.

## 2022-09-23 ENCOUNTER — Ambulatory Visit: Payer: 59 | Admitting: Nurse Practitioner
